# Patient Record
Sex: Female | Born: 1960 | Race: White | Hispanic: No | Marital: Married | Smoking: Never smoker
Health system: Southern US, Community
[De-identification: ages and names within clinical notes are randomized; demographics above are authoritative.]

## PROBLEM LIST (undated history)

## (undated) DIAGNOSIS — G43711 Chronic migraine without aura, intractable, with status migrainosus: Secondary | ICD-10-CM

## (undated) DIAGNOSIS — F32A Depression, unspecified: Secondary | ICD-10-CM

## (undated) DIAGNOSIS — M797 Fibromyalgia: Secondary | ICD-10-CM

## (undated) DIAGNOSIS — I10 Essential (primary) hypertension: Secondary | ICD-10-CM

## (undated) DIAGNOSIS — K7460 Unspecified cirrhosis of liver: Secondary | ICD-10-CM

## (undated) DIAGNOSIS — Z8489 Family history of other specified conditions: Secondary | ICD-10-CM

## (undated) DIAGNOSIS — L0390 Cellulitis, unspecified: Secondary | ICD-10-CM

## (undated) DIAGNOSIS — R519 Headache, unspecified: Secondary | ICD-10-CM

## (undated) DIAGNOSIS — M1990 Unspecified osteoarthritis, unspecified site: Secondary | ICD-10-CM

## (undated) DIAGNOSIS — F329 Major depressive disorder, single episode, unspecified: Secondary | ICD-10-CM

## (undated) DIAGNOSIS — C801 Malignant (primary) neoplasm, unspecified: Secondary | ICD-10-CM

## (undated) DIAGNOSIS — R51 Headache: Secondary | ICD-10-CM

## (undated) HISTORY — PX: OTHER SURGICAL HISTORY: SHX169

## (undated) HISTORY — PX: APPENDECTOMY: SHX54

## (undated) HISTORY — DX: Unspecified cirrhosis of liver: K74.60

## (undated) HISTORY — DX: Chronic migraine without aura, intractable, with status migrainosus: G43.711

## (undated) HISTORY — PX: ABDOMINAL HYSTERECTOMY: SHX81

## (undated) HISTORY — PX: BREAST SURGERY: SHX581

## (undated) HISTORY — PX: BACK SURGERY: SHX140

## (undated) HISTORY — PX: DILATION AND CURETTAGE OF UTERUS: SHX78

## (undated) HISTORY — DX: Cellulitis, unspecified: L03.90

## (undated) HISTORY — PX: CERVICAL SPINE SURGERY: SHX589

---

## 2015-08-01 ENCOUNTER — Other Ambulatory Visit: Payer: Self-pay | Admitting: Gastroenterology

## 2015-08-01 DIAGNOSIS — R634 Abnormal weight loss: Secondary | ICD-10-CM

## 2015-08-01 DIAGNOSIS — R1032 Left lower quadrant pain: Secondary | ICD-10-CM

## 2015-08-08 ENCOUNTER — Ambulatory Visit
Admission: RE | Admit: 2015-08-08 | Discharge: 2015-08-08 | Disposition: A | Discharge status: Home / Self Care | Payer: BLUE CROSS/BLUE SHIELD | Source: Ambulatory Visit | Attending: Gastroenterology | Admitting: Gastroenterology

## 2015-08-08 ENCOUNTER — Encounter (INDEPENDENT_AMBULATORY_CARE_PROVIDER_SITE_OTHER): Payer: Self-pay

## 2015-08-08 DIAGNOSIS — R1032 Left lower quadrant pain: Secondary | ICD-10-CM

## 2015-08-08 DIAGNOSIS — R634 Abnormal weight loss: Secondary | ICD-10-CM

## 2015-08-08 MED ORDER — IOPAMIDOL (ISOVUE-300) INJECTION 61%
125.0000 mL | Freq: Once | INTRAVENOUS | Status: AC | PRN
  Administered 2015-08-08: 125 mL via INTRAVENOUS

## 2016-01-20 ENCOUNTER — Other Ambulatory Visit: Payer: Self-pay | Admitting: Gastroenterology

## 2016-01-20 DIAGNOSIS — K7460 Unspecified cirrhosis of liver: Secondary | ICD-10-CM

## 2016-01-20 DIAGNOSIS — C220 Liver cell carcinoma: Secondary | ICD-10-CM

## 2016-01-31 ENCOUNTER — Other Ambulatory Visit: Payer: BLUE CROSS/BLUE SHIELD

## 2016-02-08 ENCOUNTER — Other Ambulatory Visit: Payer: BLUE CROSS/BLUE SHIELD

## 2016-03-23 ENCOUNTER — Encounter (HOSPITAL_COMMUNITY): Payer: Self-pay | Admitting: *Deleted

## 2016-03-23 ENCOUNTER — Other Ambulatory Visit: Payer: Self-pay | Admitting: Gastroenterology

## 2016-03-28 ENCOUNTER — Ambulatory Visit (HOSPITAL_COMMUNITY): Payer: BLUE CROSS/BLUE SHIELD | Admitting: Anesthesiology

## 2016-03-28 ENCOUNTER — Encounter (HOSPITAL_COMMUNITY): Payer: Self-pay

## 2016-03-28 ENCOUNTER — Encounter (HOSPITAL_COMMUNITY)
Admission: RE | Disposition: A | Discharge status: Home / Self Care | Payer: Self-pay | Source: Ambulatory Visit | Attending: Gastroenterology

## 2016-03-28 ENCOUNTER — Ambulatory Visit (HOSPITAL_COMMUNITY)
Admission: RE | Admit: 2016-03-28 | Discharge: 2016-03-28 | Disposition: A | Discharge status: Home / Self Care | Payer: BLUE CROSS/BLUE SHIELD | Source: Ambulatory Visit | Attending: Gastroenterology | Admitting: Gastroenterology

## 2016-03-28 DIAGNOSIS — Z6841 Body Mass Index (BMI) 40.0 and over, adult: Secondary | ICD-10-CM | POA: Diagnosis not present

## 2016-03-28 DIAGNOSIS — I10 Essential (primary) hypertension: Secondary | ICD-10-CM | POA: Diagnosis not present

## 2016-03-28 DIAGNOSIS — Z79899 Other long term (current) drug therapy: Secondary | ICD-10-CM | POA: Diagnosis not present

## 2016-03-28 DIAGNOSIS — K449 Diaphragmatic hernia without obstruction or gangrene: Secondary | ICD-10-CM | POA: Diagnosis not present

## 2016-03-28 DIAGNOSIS — K7460 Unspecified cirrhosis of liver: Secondary | ICD-10-CM | POA: Insufficient documentation

## 2016-03-28 HISTORY — PX: ESOPHAGOGASTRODUODENOSCOPY (EGD) WITH PROPOFOL: SHX5813

## 2016-03-28 HISTORY — DX: Headache: R51

## 2016-03-28 HISTORY — DX: Family history of other specified conditions: Z84.89

## 2016-03-28 HISTORY — DX: Fibromyalgia: M79.7

## 2016-03-28 HISTORY — DX: Essential (primary) hypertension: I10

## 2016-03-28 HISTORY — DX: Depression, unspecified: F32.A

## 2016-03-28 HISTORY — DX: Unspecified osteoarthritis, unspecified site: M19.90

## 2016-03-28 HISTORY — DX: Malignant (primary) neoplasm, unspecified: C80.1

## 2016-03-28 HISTORY — DX: Major depressive disorder, single episode, unspecified: F32.9

## 2016-03-28 HISTORY — DX: Headache, unspecified: R51.9

## 2016-03-28 SURGERY — ESOPHAGOGASTRODUODENOSCOPY (EGD) WITH PROPOFOL
Anesthesia: Monitor Anesthesia Care

## 2016-03-28 MED ORDER — LIDOCAINE 2% (20 MG/ML) 5 ML SYRINGE
INTRAMUSCULAR | Status: DC | PRN
  Administered 2016-03-28: 50 mg via INTRAVENOUS

## 2016-03-28 MED ORDER — SODIUM CHLORIDE 0.9 % IV SOLN: INTRAVENOUS | Status: DC

## 2016-03-28 MED ORDER — PROPOFOL 10 MG/ML IV BOLUS
INTRAVENOUS | Status: AC
  Filled 2016-03-28: qty 20

## 2016-03-28 MED ORDER — LACTATED RINGERS IV SOLN
INTRAVENOUS | Status: DC
  Administered 2016-03-28: 1000 mL via INTRAVENOUS

## 2016-03-28 MED ORDER — PROPOFOL 10 MG/ML IV BOLUS
INTRAVENOUS | Status: DC | PRN
  Administered 2016-03-28 (×2): 50 mg via INTRAVENOUS

## 2016-03-28 SURGICAL SUPPLY — 14 items

## 2016-03-28 NOTE — H&P (Signed)
Patient interval history reviewed.  Patient examined again.  There has been no change from documented H/P dated 03/23/16 (scanned into chart from our office) except as documented above.  Assessment:   1.  Cirrhosis.  Plan:  1.  Endoscopy for variceal screening. 2.  Risks (bleeding, infection, bowel perforation that could require surgery, sedation-related changes in cardiopulmonary systems), benefits (identification and possible treatment of source of symptoms, exclusion of certain causes of symptoms), and alternatives (watchful waiting, radiographic imaging studies, empiric medical treatment) of upper endoscopy (EGD) were explained to patient/family in detail and patient wishes to proceed.

## 2016-03-28 NOTE — Op Note (Signed)
[facility] Patient Name: Rhonda Osborne Procedure Date: 03/28/2016 MRN: [id number] Attending MD: Arta Silence , MD Date of Birth: Mar 29, 1961 CSN: [id number] Age: 55 Admit Type: Outpatient Procedure:                Upper GI endoscopy Indications:              Screening procedure, cirrhosis Providers:                Arta Silence, MD, Carolynn Comment, RN, Corliss Parish, Technician Referring MD:             Judge Stall, FNP; Alvino Chapel, MD Medicines:                Monitored Anesthesia Care Complications:            No immediate complications. Estimated Blood Loss:     Estimated blood loss: none. Procedure:                Pre-Anesthesia Assessment:                           - Prior to the procedure, a History and Physical                            was performed, and patient medications and                            allergies were reviewed. The patient's tolerance of                            previous anesthesia was also reviewed. The risks                            and benefits of the procedure and the sedation                            options and risks were discussed with the patient.                            All questions were answered, and informed consent                            was obtained. Prior Anticoagulants: The patient has                            taken no previous anticoagulant or antiplatelet                            agents. ASA Grade Assessment: III - A patient with                            severe systemic disease. After reviewing the risks  and benefits, the patient was deemed in                            satisfactory condition to undergo the procedure.                           After obtaining informed consent, the endoscope was                            passed under direct vision. Throughout the                            procedure, the patient's blood pressure,  pulse, and                            oxygen saturations were monitored continuously. The                            [id number] [id number]) scope was introduced through the                            mouth, and advanced to the second part of duodenum.                            The upper GI endoscopy was accomplished without                            difficulty. The patient tolerated the procedure                            well. Scope In: Scope Out: Findings:      The examined esophagus was normal. No esophageal varices.      A small hiatal hernia was present.      The exam of the stomach was otherwise normal. No gastric varices.      The duodenal bulb, first portion of the duodenum and second portion of       the duodenum were normal. Impression:               - Normal esophagus.                           - Small hiatal hernia.                           - Normal duodenal bulb, first portion of the                            duodenum and second portion of the duodenum. Moderate Sedation:      None Recommendation:           - Patient has a contact number available for                            emergencies. The signs and symptoms of potential  delayed complications were discussed with the                            patient. Return to normal activities tomorrow.                            Written discharge instructions were provided to the                            patient.                           - Discharge patient to home (ambulatory).                           - Resume previous diet today.                           - Continue present medications.                           - Return to GI clinic in 3 months.                           - Return to referring physician as previously                            scheduled. Procedure Code(s):        --- Professional ---                           [phone], Esophagogastroduodenoscopy, flexible,                             transoral; diagnostic, including collection of                            specimen(s) by brushing or washing, when performed                            (separate procedure) Diagnosis Code(s):        --- Professional ---                           K44.9, Diaphragmatic hernia without obstruction or                            gangrene                           Z13.810, Encounter for screening for upper                            gastrointestinal disorder CPT copyright 2016 [employer]. All rights reserved. The codes documented in this report are preliminary and upon coder review may  be revised to meet current compliance requirements. Arta Silence, MD 03/28/2016 11:17:43 AM This report has been signed  electronically. Number of Addenda: 0

## 2016-03-28 NOTE — Discharge Instructions (Signed)

## 2016-03-28 NOTE — Transfer of Care (Signed)
Immediate Anesthesia Transfer of Care Note  Patient: Rhonda Osborne  Procedure(s) Performed: Procedure(s): ESOPHAGOGASTRODUODENOSCOPY (EGD) WITH PROPOFOL (N/A)  Patient Location: PACU  Anesthesia Type:MAC  Level of Consciousness: awake, alert  and oriented  Airway & Oxygen Therapy: Patient Spontanous Breathing and Patient connected to nasal cannula oxygen  Post-op Assessment: Report given to RN and Post -op Vital signs reviewed and stable  Post vital signs: Reviewed and stable  Last Vitals:  Vitals:   03/28/16 0935  BP: (!) 142/81  Pulse: 75  Resp: 19  Temp: 37.2 C    Last Pain:  Vitals:   03/28/16 0935  TempSrc: Oral         Complications: No apparent anesthesia complications

## 2016-03-28 NOTE — Anesthesia Preprocedure Evaluation (Addendum)
Anesthesia Evaluation  Patient identified by MRN, date of birth, ID band Patient awake    Reviewed: Allergy & Precautions, NPO status , Patient's Chart, lab work & pertinent test results  Airway Mallampati: III  TM Distance: <3 FB Neck ROM: Full    Dental no notable dental hx.    Pulmonary neg pulmonary ROS,    Pulmonary exam normal breath sounds clear to auscultation       Cardiovascular hypertension, Pt. on medications and Pt. on home beta blockers Normal cardiovascular exam Rhythm:Regular Rate:Normal     Neuro/Psych negative neurological ROS  negative psych ROS   GI/Hepatic negative GI ROS, Neg liver ROS,   Endo/Other  Morbid obesity  Renal/GU negative Renal ROS  negative genitourinary   Musculoskeletal negative musculoskeletal ROS (+)   Abdominal   Peds negative pediatric ROS (+)  Hematology negative hematology ROS (+)   Anesthesia Other Findings   Reproductive/Obstetrics negative OB ROS                            Anesthesia Physical Anesthesia Plan  ASA: III  Anesthesia Plan: MAC   Post-op Pain Management:    Induction: Intravenous  Airway Management Planned: Nasal Cannula  Additional Equipment:   Intra-op Plan:   Post-operative Plan:   Informed Consent: I have reviewed the patients History and Physical, chart, labs and discussed the procedure including the risks, benefits and alternatives for the proposed anesthesia with the patient or authorized representative who has indicated his/her understanding and acceptance.   Dental advisory given  Plan Discussed with: CRNA and Surgeon  Anesthesia Plan Comments:        Anesthesia Quick Evaluation

## 2016-03-29 ENCOUNTER — Encounter (HOSPITAL_COMMUNITY): Payer: Self-pay | Admitting: Gastroenterology

## 2016-03-29 NOTE — Anesthesia Postprocedure Evaluation (Signed)
Anesthesia Post Note  Patient: Rhonda Osborne  Procedure(s) Performed: Procedure(s) (LRB): ESOPHAGOGASTRODUODENOSCOPY (EGD) WITH PROPOFOL (N/A)  Patient location during evaluation: PACU Anesthesia Type: MAC Level of consciousness: awake and alert Pain management: pain level controlled Vital Signs Assessment: post-procedure vital signs reviewed and stable Respiratory status: spontaneous breathing, nonlabored ventilation, respiratory function stable and patient connected to nasal cannula oxygen Cardiovascular status: stable and blood pressure returned to baseline Anesthetic complications: no    Last Vitals:  Vitals:   03/28/16 1110 03/28/16 1120  BP: (!) 126/45 (!) 138/47  Pulse: 76 65  Resp: 16 15  Temp:      Last Pain:  Vitals:   03/28/16 1105  TempSrc: Oral                 Braxtyn Dorff S

## 2016-07-24 ENCOUNTER — Ambulatory Visit (INDEPENDENT_AMBULATORY_CARE_PROVIDER_SITE_OTHER): Payer: BLUE CROSS/BLUE SHIELD | Admitting: Neurology

## 2016-07-24 ENCOUNTER — Encounter: Payer: Self-pay | Admitting: Neurology

## 2016-07-24 VITALS — BP 138/82 | HR 88 | Ht 64.0 in | Wt 239.0 lb

## 2016-07-24 DIAGNOSIS — G43711 Chronic migraine without aura, intractable, with status migrainosus: Secondary | ICD-10-CM | POA: Diagnosis not present

## 2016-07-24 DIAGNOSIS — R9089 Other abnormal findings on diagnostic imaging of central nervous system: Secondary | ICD-10-CM | POA: Diagnosis not present

## 2016-07-24 HISTORY — DX: Chronic migraine without aura, intractable, with status migrainosus: G43.711

## 2016-07-24 MED ORDER — ZONISAMIDE 100 MG PO CAPS: 100.0000 mg | ORAL_CAPSULE | Freq: Two times a day (BID) | ORAL | 3 refills | Status: DC

## 2016-07-24 MED ORDER — ZONISAMIDE 50 MG PO CAPS: 50.0000 mg | ORAL_CAPSULE | Freq: Every day | ORAL | 0 refills | Status: DC

## 2016-07-24 NOTE — Progress Notes (Signed)
Reason for visit: Possible multiple sclerosis  Referring physician: Dr. Trilby Drummer Rhonda Osborne is a 55 y.o. female  History of present illness:  Rhonda Osborne is a 56 year old right-handed white female with a diagnosis of multiple sclerosis that occurred in April 2016. The patient had been followed by Dr. Starleen Blue with severe neck pain. The patient was referred for surgery which eventually occurred. The patient had noted some gait instability, she has been using a cane for ambulation. The patient was sent for further evaluation and was noted to have a white matter lesion in the right parietal area, MRI brain report that I found from November 2016 indicates a single lesion in the right parietal white matter. The patient apparently underwent a visual evoked response test that was normal, and brainstem auditory evoked response test was normal. The patient never had a lumbar puncture. The patient was diagnosed with multiple sclerosis and placed on Tysabri. The patient continued to Tysabri until September 2017. The patient was seen by another neurologist for evaluation and the diagnosis of multiple sclerosis was questioned. The patient also has a history of chronic migraine headaches. The patient indicates that she has about 15-20 headache days a month, and many of her headaches may last 3 or 4 days. She has been on multiple medications for her headache including Imitrex, Maxalt, Topamax, atenolol, propranolol, metoprolol, amitriptyline, Lyrica, Cymbalta, Norvasc, and currently she is on Zonegran. The patient is gaining no benefit with her headaches. She has photophobia and phonophobia, and some nausea. The patient may have some blurring of vision. She has a very prominent family history of migraine that includes her father, 2 of her children, and 3 sisters. The patient has the diagnosis of fibromyalgia, she has pain in the neck, shoulders, back, thighs, and legs. She has interstitial cystitis. She walks with a  cane. She notes some numbness in the feet. She comes to this office for further management.  Past Medical History:  Diagnosis Date  . Arthritis   . Cancer ([place])    breast cancer-right  . Depression   . Family history of adverse reaction to anesthesia    mother age 72, in 33 , was to have exploratory surgery and was given a sedative before starting surgery and found to have unknown heart problem and died from medication she was given before the surgery  . Fibromyalgia   . Headache    migraines  . Hypertension   . Liver cirrhosis [facility])     Past Surgical History:  Procedure Laterality Date  . ABDOMINAL HYSTERECTOMY     left ovaries  . APPENDECTOMY    . BACK SURGERY    . BREAST SURGERY     right  . CERVICAL SPINE SURGERY     between C5-C6 and C6-C7  . CESAREAN SECTION  1991,1994   x 2  . DILATION AND CURETTAGE OF UTERUS    . ESOPHAGOGASTRODUODENOSCOPY (EGD) WITH PROPOFOL N/A 03/28/2016   Procedure: ESOPHAGOGASTRODUODENOSCOPY (EGD) WITH PROPOFOL;  Surgeon: Arta Silence, MD;  Location: [facility];  Service: Endoscopy;  Laterality: N/A;    Family History  Problem Relation Age of Onset  . Heart attack Mother   . Lung cancer Mother   . Heart attack Father     Social history:  reports that she has never smoked. She has never used smokeless tobacco. She reports that she does not drink alcohol or use drugs.  Medications:  Prior to Admission medications   Medication Sig Start Date End Date  Taking? Authorizing Provider  amLODipine (NORVASC) 2.5 MG tablet Take 2.5 mg by mouth at bedtime.   Yes Historical Provider, MD  Cyanocobalamin (VITAMIN B 12 PO) Take 2,500 mg by mouth at bedtime.   Yes Historical Provider, MD  diazepam (VALIUM) 5 MG tablet Take 5 mg by mouth at bedtime.   Yes Historical Provider, MD  DULoxetine (CYMBALTA) 60 MG capsule Take 60 mg by mouth at bedtime.   Yes Historical Provider, MD  HYDROcodone-acetaminophen (NORCO/VICODIN) 5-325 MG tablet Take 1 tablet by  mouth every 6 (six) hours as needed for moderate pain. Takes 1/2 tablet in morning and 1/2 tablet at bedtime   Yes Historical Provider, MD  letrozole (FEMARA) 2.5 MG tablet Take 2.5 mg by mouth daily. 04/18/16  Yes Historical Provider, MD  levofloxacin (LEVAQUIN) 500 MG tablet Take 500 mg by mouth daily. 07/20/16  Yes Historical Provider, MD  LYRICA 100 MG capsule Take 100 mg by mouth 3 (three) times daily. 06/29/16  Yes Historical Provider, MD  methocarbamol (ROBAXIN) 500 MG tablet Take 500 mg by mouth 4 (four) times daily. Takes 1/2 tablet in morning and 1/2 tablet at bedtime   Yes Historical Provider, MD  metoprolol succinate (TOPROL-XL) 100 MG 24 hr tablet Take 100 mg by mouth at bedtime. Take with or immediately following a meal.   Yes Historical Provider, MD  pentosan polysulfate (ELMIRON) 100 MG capsule Take 100 mg by mouth 2 (two) times daily. Takes 100 mg in morning and 200 mg at bedtime   Yes Historical Provider, MD  Vitamin D, Ergocalciferol, (DRISDOL) 50000 units CAPS capsule Take 50,000 Units by mouth every 7 (seven) days. Takes it on Tuesday every week   Yes Historical Provider, MD  zonisamide (ZONEGRAN) 100 MG capsule Take 1 capsule (100 mg total) by mouth 2 (two) times daily. 07/24/16   Kathrynn Ducking, MD  zonisamide (ZONEGRAN) 50 MG capsule Take 1 capsule (50 mg total) by mouth daily. 07/24/16   Kathrynn Ducking, MD      Allergies  Allergen Reactions  . Augmentin [Amoxicillin-Pot Clavulanate] Diarrhea  . Flagyl [Metronidazole]     Feeling of throat swelling  . Penicillins Diarrhea  . Tape     Red, itchy,puffy, and redness spread out from tape area    ROS:  Out of a complete 14 system review of symptoms, the patient complains only of the following symptoms, and all other reviewed systems are negative.  Fatigue Blurred vision Itching Incontinence of the bladder Increased thirst Joint pain, joint swelling, aching muscles Frequent infections Memory loss, confusion,  headache, numbness, weakness, slurred speech, tremor Depression, anxiety, too much sleep, decreased energy, change in appetite Insomnia  Blood pressure 138/82, pulse 88, height 5\' 4"  (1.626 m), weight 239 lb (108.4 kg).  Physical Exam  General: The patient is alert and cooperative at the time of the examination. The patient is markedly obese.  Eyes: Pupils are equal, round, and reactive to light. Discs are flat bilaterally.  Neck: The neck is supple, no carotid bruits are noted.  Respiratory: The respiratory examination is clear.  Cardiovascular: The cardiovascular examination reveals a regular rate and rhythm, no obvious murmurs or rubs are noted.  Skin: Extremities are without significant edema.  Neurologic Exam  Mental status: The patient is alert and oriented x 3 at the time of the examination. The patient has apparent normal recent and remote memory, with an apparently normal attention span and concentration ability. Mini-Mental status done today shows a total score of 30/30.  Cranial nerves: Facial symmetry is present. There is good sensation of the face to pinprick and soft touch bilaterally. The strength of the facial muscles and the muscles to head turning and shoulder shrug are normal bilaterally. Speech is well enunciated, no aphasia or dysarthria is noted. Extraocular movements are full. Visual fields are full. The tongue is midline, and the patient has symmetric elevation of the soft palate. No obvious hearing deficits are noted.  Motor: The motor testing reveals 5 over 5 strength of all 4 extremities. Good symmetric motor tone is noted throughout.  Sensory: Sensory testing is intact to pinprick, soft touch, vibration sensation, and position sense on all 4 extremities, with exception of some decreased pinprick sensation below the knees bilaterally. No evidence of extinction is noted.  Coordination: Cerebellar testing reveals good finger-nose-finger and heel-to-shin  bilaterally.  Gait and station: Gait is normal. The patient normally walks with a cane. Tandem gait is normal. Romberg is negative. No drift is seen.  Reflexes: Deep tendon reflexes are symmetric and normal bilaterally. Toes are downgoing bilaterally.   Assessment/Plan:  1. Diagnosis of multiple sclerosis  2. Fibromyalgia  3. Intractable migraine headache  4. Interstitial cystitis  5. Obesity  The diagnosis of multiple sclerosis in my mind is in question. The MRI report done on 04/03/2015 shows a single white matter lesion in the left parietal area. The patient has a history of chronic migraine, which may result in white matter lesions. MRI of the cervical spine does not show any abnormalities within the spinal cord. The patient has had a normal visual evoked response test, and a normal brainstem auditory evoked response test. Lumbar puncture was never done. The patient will be treated for her migraine, she will be increased on the dose of Zonegran eventually to 100 mg twice daily. I will try to get her set up for Botox injections. I will set up a lumbar puncture to look for oligoclonal banding to confirm or exclude the diagnosis of multiple sclerosis. She will follow-up in 4 months.  Rhonda Alexanders MD 07/24/2016 3:51 PM  [facility] [address] [place], [place] [address]  Phone [phone] Fax [phone]

## 2016-07-26 ENCOUNTER — Telehealth: Payer: Self-pay | Admitting: Neurology

## 2016-07-26 NOTE — Telephone Encounter (Signed)
I called patient to schedule botox injections. She did not answer so I left a VM asking her to call me back.

## 2016-07-31 ENCOUNTER — Telehealth: Payer: Self-pay | Admitting: Neurology

## 2016-07-31 NOTE — Telephone Encounter (Signed)
I reviewed the MRI the brain that was done on 01/28/2015. The study to me looks essentially normal, I do not see any evidence of significant white matter lesions, no evidence of changes consistent with multiple sclerosis.  MRI of the cervical spine done on the same date did not show any myelopathic changes, minimal disc bulges were noted at the C4-5, C 5-6, and C6-7 levels. The patient has had prior fusion at the C5 and C6 levels. MRI of the lumbar spine was done on 01/05/2016. Some scoliosis was noted. The study was otherwise unremarkable, minimal disc bulge at the L3-4 level.  I called the patient, talk with the husband, I do not see any evidence of multiple sclerosis on MRI the brain done in 2016. The patient will be set up for lumbar puncture to look for oligoclonal banding.

## 2016-08-08 ENCOUNTER — Ambulatory Visit
Admission: RE | Admit: 2016-08-08 | Discharge: 2016-08-08 | Disposition: A | Discharge status: Home / Self Care | Payer: BLUE CROSS/BLUE SHIELD | Source: Ambulatory Visit | Attending: Neurology | Admitting: Neurology

## 2016-08-08 DIAGNOSIS — G43711 Chronic migraine without aura, intractable, with status migrainosus: Secondary | ICD-10-CM

## 2016-08-08 DIAGNOSIS — R9089 Other abnormal findings on diagnostic imaging of central nervous system: Secondary | ICD-10-CM

## 2016-08-08 LAB — CSF CELL COUNT WITH DIFFERENTIAL
RBC Count, CSF: 3 cells/uL (ref 0–10)
WBC, CSF: 3 cells/uL (ref 0–5)

## 2016-08-08 LAB — GLUCOSE, CSF: Glucose, CSF: 72 mg/dL (ref 43–76)

## 2016-08-08 LAB — PROTEIN, CSF: Total Protein, CSF: 43 mg/dL (ref 15–45)

## 2016-08-08 NOTE — Discharge Instructions (Signed)

## 2016-08-08 NOTE — Progress Notes (Signed)
1 SST tube drawn from left AC. Site is unremarkable and pt tolerated procedure well.

## 2016-08-10 ENCOUNTER — Telehealth: Payer: Self-pay

## 2016-08-10 LAB — ANGIOTENSIN CONVERTING ENZYME, CSF: ACE, CSF: 10 U/L (ref <=15)

## 2016-08-10 LAB — VDRL, CSF: VDRL Quant, CSF: NONREACTIVE

## 2016-08-10 NOTE — Telephone Encounter (Signed)
Spoke with patient's husband after Rhonda Osborne had an LP here 08/08/16.  He states she is doing well, just a little sore in her back from the procedure.  He denies she developed a headache.  jkl

## 2016-08-13 LAB — B. BURGDORFI ANTIBODIES, CSF: LYME AB: NEGATIVE

## 2016-08-17 ENCOUNTER — Telehealth: Payer: Self-pay | Admitting: Neurology

## 2016-08-17 DIAGNOSIS — R9089 Other abnormal findings on diagnostic imaging of central nervous system: Secondary | ICD-10-CM

## 2016-08-17 DIAGNOSIS — R269 Unspecified abnormalities of gait and mobility: Secondary | ICD-10-CM

## 2016-08-17 LAB — OLIGOCLONAL BANDS, CSF + SERM

## 2016-08-17 NOTE — Telephone Encounter (Signed)
I called patient, talk with the husband. 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  I will set patient up for blood work to look for a connective tissue disease process or a chronic infection. The patient has been determined to have cirrhosis of the liver on a CT scan of the abdomen, but the husband indicates that she never really had any thorough investigation into why this had occurred.  The patient has had a hepatitis A and hepatitis B vaccination.  The spinal fluid analysis is not diagnostic for typical for multiple sclerosis. I do not believe that this patient has multiple sclerosis.  I will order blood work on this patient to look for other abnormalities that could produce the oligoclonal spikes in the peripheral blood and central nervous system.

## 2016-08-22 ENCOUNTER — Other Ambulatory Visit (INDEPENDENT_AMBULATORY_CARE_PROVIDER_SITE_OTHER): Payer: Self-pay

## 2016-08-22 DIAGNOSIS — Z0289 Encounter for other administrative examinations: Secondary | ICD-10-CM

## 2016-08-22 DIAGNOSIS — R9089 Other abnormal findings on diagnostic imaging of central nervous system: Secondary | ICD-10-CM

## 2016-08-22 DIAGNOSIS — R269 Unspecified abnormalities of gait and mobility: Secondary | ICD-10-CM

## 2016-08-24 ENCOUNTER — Telehealth: Payer: Self-pay | Admitting: *Deleted

## 2016-08-24 LAB — PAN-ANCA
ANCA Proteinase 3: <3.5 U/mL (ref 0.0–3.5)
Atypical pANCA: <1:20 {titer}
Myeloperoxidase Ab: <9 U/mL (ref 0.0–9.0)

## 2016-08-24 LAB — EBV, CHRONIC/ACTIVE INFECTION
EBV Early Antigen Ab, IgG: <9 U/mL (ref 0.0–8.9)
EBV NA IGG: 188 U/mL — AB (ref 0.0–17.9)
EBV VCA IGG: 244 U/mL — AB (ref 0.0–17.9)

## 2016-08-24 LAB — MULTIPLE MYELOMA PANEL, SERUM
ALBUMIN SERPL ELPH-MCNC: 4 g/dL (ref 2.9–4.4)
Albumin/Glob SerPl: 1.7 (ref 0.7–1.7)
Alpha 1: 0.2 g/dL (ref 0.0–0.4)
Alpha2 Glob SerPl Elph-Mcnc: 0.5 g/dL (ref 0.4–1.0)
B-Globulin SerPl Elph-Mcnc: 1 g/dL (ref 0.7–1.3)
Gamma Glob SerPl Elph-Mcnc: 0.8 g/dL (ref 0.4–1.8)
Globulin, Total: 2.4 g/dL (ref 2.2–3.9)
IGA/IMMUNOGLOBULIN A, SERUM: 172 mg/dL (ref 87–352)
IgG (Immunoglobin G), Serum: 657 mg/dL — ABNORMAL LOW (ref 700–1600)
IgM (Immunoglobulin M), Srm: 59 mg/dL (ref 26–217)
TOTAL PROTEIN: 6.4 g/dL (ref 6.0–8.5)

## 2016-08-24 LAB — RHEUMATOID FACTOR: Rhuematoid fact SerPl-aCnc: <10 IU/mL (ref 0.0–13.9)

## 2016-08-24 LAB — GLIADIN ANTIBODIES, SERUM
Antigliadin Abs, IgA: 4 units (ref 0–19)
Gliadin IgG: 2 units (ref 0–19)

## 2016-08-24 LAB — ANA W/REFLEX: ANA: NEGATIVE

## 2016-08-24 LAB — HEPATITIS B CORE ANTIBODY, TOTAL: Hep B Core Total Ab: NEGATIVE

## 2016-08-24 LAB — HIV ANTIBODY (ROUTINE TESTING W REFLEX): HIV SCREEN 4TH GENERATION: NONREACTIVE

## 2016-08-24 LAB — SEDIMENTATION RATE: Sed Rate: 5 mm/hr (ref 0–40)

## 2016-08-24 LAB — HEPATITIS C ANTIBODY

## 2016-08-24 NOTE — Telephone Encounter (Signed)
Called and left detailed message about lab results per CW,MD note. Gave [facility] phone number if she has further questions or concerns.

## 2016-08-24 NOTE — Telephone Encounter (Signed)
-----   Message from Kathrynn Ducking, MD sent at 08/24/2016  7:35 AM EDT ----- Blood work is unremarkable, EBV viral panel indicates prior exposure, this pattern is seen in the majority of population. Please call the patient. ----- Message ----- From: Lavone Neri Lab Results In Sent: 08/23/2016   7:42 AM To: Kathrynn Ducking, MD

## 2016-08-31 ENCOUNTER — Other Ambulatory Visit: Payer: Self-pay | Admitting: Gastroenterology

## 2016-08-31 DIAGNOSIS — K7469 Other cirrhosis of liver: Secondary | ICD-10-CM

## 2016-09-07 ENCOUNTER — Ambulatory Visit
Admission: RE | Admit: 2016-09-07 | Discharge: 2016-09-07 | Disposition: A | Discharge status: Home / Self Care | Payer: BLUE CROSS/BLUE SHIELD | Source: Ambulatory Visit | Attending: Gastroenterology | Admitting: Gastroenterology

## 2016-09-07 DIAGNOSIS — K7469 Other cirrhosis of liver: Secondary | ICD-10-CM

## 2016-11-22 ENCOUNTER — Ambulatory Visit: Payer: BLUE CROSS/BLUE SHIELD | Admitting: Neurology

## 2016-11-25 ENCOUNTER — Other Ambulatory Visit: Payer: Self-pay | Admitting: Neurology

## 2016-11-26 ENCOUNTER — Ambulatory Visit (INDEPENDENT_AMBULATORY_CARE_PROVIDER_SITE_OTHER): Payer: BLUE CROSS/BLUE SHIELD | Admitting: Neurology

## 2016-11-26 ENCOUNTER — Encounter: Payer: Self-pay | Admitting: Neurology

## 2016-11-26 ENCOUNTER — Other Ambulatory Visit: Payer: Self-pay

## 2016-11-26 VITALS — BP 154/83 | HR 94 | Ht 64.0 in | Wt 226.0 lb

## 2016-11-26 DIAGNOSIS — G43711 Chronic migraine without aura, intractable, with status migrainosus: Secondary | ICD-10-CM

## 2016-11-26 DIAGNOSIS — R9089 Other abnormal findings on diagnostic imaging of central nervous system: Secondary | ICD-10-CM

## 2016-11-26 MED ORDER — ZONISAMIDE 100 MG PO CAPS: 100.0000 mg | ORAL_CAPSULE | Freq: Two times a day (BID) | ORAL | 3 refills | Status: DC

## 2016-11-26 MED ORDER — TIZANIDINE HCL 2 MG PO TABS: 2.0000 mg | ORAL_TABLET | Freq: Three times a day (TID) | ORAL | 2 refills | Status: DC

## 2016-11-26 NOTE — Patient Instructions (Signed)
   We will go on Tizanidine 2 mg three times a day.

## 2016-11-26 NOTE — [place] Notes (Signed)
Reason for visit: Headache  Rhonda Osborne is an 56 y.o. female  History of present illness:  Rhonda Osborne is a 56 year old right handed white female with a history of headache and neuromuscular pain of the neck, low back, and legs. The patient has been placed on Zonegran which seems to help her headaches, she will have a severe headache only once every 3 weeks or so. The patient does have neck pain and shoulder discomfort, she is bothered by pain in the mid back, and discomfort down both legs. The patient has interstitial cystitis as well. She has been on Cymbalta and Lyrica without complete improvement in her pain. The patient uses a cane for ambulation. She feels better when she is lying down, worse when she is sitting in a car seat or if she is up on her feet. The patient does have some chronic issues with balance that may have worsened some on the Lyrica. She takes hydrocodone twice a day. The patient is having tremors involving the upper extremities, she has some slight memory issues and she has floaters in the eyes. She has a lot of allergy problems. She comes to this office for an evaluation. She has had an evaluation for multiple sclerosis, lumbar puncture did not show evidence of oligoclonal banding within the central nervous system. Blood work studies were unremarkable. The patient is followed as well for fatty liver with cirrhosis of the liver.  Past Medical History:  Diagnosis Date  . Arthritis   . Cancer ([place])    breast cancer-right  . Chronic migraine without aura, with intractable migraine, so stated, with status migrainosus 07/24/2016  . Depression   . Family history of adverse reaction to anesthesia    mother age 68, in 28 , was to have exploratory surgery and was given a sedative before starting surgery and found to have unknown heart problem and died from medication she was given before the surgery  . Fibromyalgia   . Headache    migraines  . Hypertension   . Liver  cirrhosis [facility])     Past Surgical History:  Procedure Laterality Date  . ABDOMINAL HYSTERECTOMY     left ovaries  . APPENDECTOMY    . BACK SURGERY    . BREAST SURGERY     right  . CERVICAL SPINE SURGERY     between C5-C6 and C6-C7  . CESAREAN SECTION  1991,1994   x 2  . DILATION AND CURETTAGE OF UTERUS    . ESOPHAGOGASTRODUODENOSCOPY (EGD) WITH PROPOFOL N/A 03/28/2016   Procedure: ESOPHAGOGASTRODUODENOSCOPY (EGD) WITH PROPOFOL;  Surgeon: Arta Silence, MD;  Location: [facility];  Service: Endoscopy;  Laterality: N/A;    Family History  Problem Relation Age of Onset  . Heart attack Mother   . Lung cancer Mother   . Heart attack Father     Social history:  reports that she has never smoked. She has never used smokeless tobacco. She reports that she does not drink alcohol or use drugs.    Allergies  Allergen Reactions  . Augmentin [Amoxicillin-Pot Clavulanate] Diarrhea  . Flagyl [Metronidazole]     Feeling of throat swelling  . Penicillins Diarrhea  . Tape     Red, itchy,puffy, and redness spread out from tape area    Medications:  Prior to Admission medications   Medication Sig Start Date End Date Taking? Authorizing Provider  amLODipine (NORVASC) 2.5 MG tablet Take 2.5 mg by mouth at bedtime.   Yes [provider]  Cyanocobalamin (VITAMIN B 12 PO) Take 2,500 mg by mouth at bedtime.   Yes [provider]  diazepam (VALIUM) 5 MG tablet Take 5 mg by mouth at bedtime.   Yes [provider]  DULoxetine (CYMBALTA) 60 MG capsule Take 60 mg by mouth at bedtime.   Yes [provider]  HYDROcodone-acetaminophen (NORCO) 10-325 MG tablet TAKE 1 TABLET BY MOUTH EVERY 6 HOURS AS NEEDED FOR PAIN FOR 30 DAYS 10/10/16  Yes [provider]  letrozole (FEMARA) 2.5 MG tablet Take 2.5 mg by mouth daily. 04/18/16  Yes [provider]  levofloxacin (LEVAQUIN) 500 MG tablet Take 500 mg by mouth daily. 07/20/16  Yes [provider]   LYRICA 100 MG capsule Take 100 mg by mouth 3 (three) times daily. 06/29/16  Yes [provider]  methocarbamol (ROBAXIN) 500 MG tablet Taking 0.25 tablet once daily.   Yes [provider]  pentosan polysulfate (ELMIRON) 100 MG capsule Take 100 mg by mouth 2 (two) times daily. Takes 100 mg in morning and 200 mg at bedtime   Yes [provider]  Vitamin D, Ergocalciferol, (DRISDOL) 50000 units CAPS capsule Take 50,000 Units by mouth every 7 (seven) days. Takes it on Tuesday every week   Yes [provider]  zonisamide (ZONEGRAN) 100 MG capsule Take 1 capsule (100 mg total) by mouth 2 (two) times daily. 07/24/16  Yes Kathrynn Ducking, MD    ROS:  Out of a complete 14 system review of symptoms, the patient complains only of the following symptoms, and all other reviewed systems are negative.  Decreased appetite, weight gain Ear discharge, hearing loss, ear pain, difficulty swallowing, drooling Eye discharge, light sensitivity, eye pain, blurred vision Wheezing, shortness of breath Cold intolerance, heat intolerance, excessive thirst Swollen abdomen, abdominal pain, rectal bleeding, constipation, diarrhea, nausea Insomnia, frequent waking, daytime sleepiness, snoring, sleep talking, acting out dreams Incontinence of the bladder Joint pain, joint swelling, back pain, achy muscles, muscle cramps, walking difficulty, neck pain, neck stiffness Skin rash, itching Memory loss, dizziness, headache, numbness, speech difficulty, weakness, tremors Depression, anxiety, hallucinations, suicidal thoughts, self injury  Blood pressure (!) 154/83, pulse 94, height 5\' 4"  (1.626 m), weight 226 lb (102.5 kg).  Physical Exam  General: The patient is alert and cooperative at the time of the examination. The patient is markedly obese.  Neuromuscular: Range of movement of the cervical spine is relatively full.  Skin: No significant peripheral edema is noted.   Neurologic  Exam  Mental status: The patient is alert and oriented x 3 at the time of the examination. The patient has apparent normal recent and remote memory, with an apparently normal attention span and concentration ability.   Cranial nerves: Facial symmetry is present. Speech is normal, no aphasia or dysarthria is noted. Extraocular movements are full. Visual fields are full.  Motor: The patient has good strength in all 4 extremities.  Sensory examination: Soft touch sensation is symmetric on the face, arms, and legs.  Coordination: The patient has good finger-nose-finger and heel-to-shin bilaterally.  Gait and station: The patient has a normal gait. The patient uses a cane for ambulation. Tandem gait is slightly unsteady. Romberg is negative. No drift is seen.  Reflexes: Deep tendon reflexes are symmetric.   Assessment/Plan:  1. History of headaches  2. Probable fibromyalgia  3. Interstitial cystitis  The patient has ongoing total-body neuromuscular discomfort. MRI of the low back showed only a disc bulge at the L3-4 level. The  patient is on Cymbalta and Lyrica. Tizanidine will be added taking 2 mg 3 times daily. The patient will call in for any dose adjustments. I have recommended that she get into a low grade exercise program, and get into a diet such as weight watcher's diet for weight loss. The patient will follow-up in 4 months.   Jill Alexanders MD 11/26/2016 1:07 PM  [facility] [address] [place], [place] [address]  Phone [phone] Fax [phone]

## 2017-02-24 ENCOUNTER — Other Ambulatory Visit: Payer: Self-pay | Admitting: Neurology

## 2017-03-18 ENCOUNTER — Other Ambulatory Visit: Payer: Self-pay | Admitting: Gastroenterology

## 2017-03-18 DIAGNOSIS — K7460 Unspecified cirrhosis of liver: Secondary | ICD-10-CM

## 2017-03-23 ENCOUNTER — Other Ambulatory Visit: Payer: Self-pay | Admitting: Neurology

## 2017-03-25 ENCOUNTER — Ambulatory Visit
Admission: RE | Admit: 2017-03-25 | Discharge: 2017-03-25 | Disposition: A | Discharge status: Home / Self Care | Payer: BLUE CROSS/BLUE SHIELD | Source: Ambulatory Visit | Attending: Gastroenterology | Admitting: Gastroenterology

## 2017-03-25 DIAGNOSIS — K7460 Unspecified cirrhosis of liver: Secondary | ICD-10-CM

## 2017-03-26 ENCOUNTER — Other Ambulatory Visit: Payer: Self-pay | Admitting: Gastroenterology

## 2017-03-26 DIAGNOSIS — K7460 Unspecified cirrhosis of liver: Secondary | ICD-10-CM

## 2017-03-26 DIAGNOSIS — K769 Liver disease, unspecified: Secondary | ICD-10-CM

## 2017-04-09 ENCOUNTER — Ambulatory Visit: Payer: BLUE CROSS/BLUE SHIELD | Admitting: Adult Health

## 2017-04-09 ENCOUNTER — Telehealth: Payer: Self-pay | Admitting: *Deleted

## 2017-04-09 NOTE — Telephone Encounter (Signed)
Patient was no show for follow up today with NP. 

## 2017-04-10 ENCOUNTER — Ambulatory Visit
Admission: RE | Admit: 2017-04-10 | Discharge: 2017-04-10 | Disposition: A | Discharge status: Home / Self Care | Payer: BLUE CROSS/BLUE SHIELD | Source: Ambulatory Visit | Attending: Gastroenterology | Admitting: Gastroenterology

## 2017-04-10 ENCOUNTER — Encounter: Payer: Self-pay | Admitting: Adult Health

## 2017-04-10 DIAGNOSIS — K769 Liver disease, unspecified: Secondary | ICD-10-CM

## 2017-04-10 DIAGNOSIS — K7460 Unspecified cirrhosis of liver: Secondary | ICD-10-CM

## 2017-04-10 MED ORDER — GADOBENATE DIMEGLUMINE 529 MG/ML IV SOLN
20.0000 mL | Freq: Once | INTRAVENOUS | Status: AC | PRN
  Administered 2017-04-10: 20 mL via INTRAVENOUS

## 2017-04-17 ENCOUNTER — Encounter: Payer: Self-pay | Admitting: Adult Health

## 2017-04-17 ENCOUNTER — Ambulatory Visit (INDEPENDENT_AMBULATORY_CARE_PROVIDER_SITE_OTHER): Payer: BLUE CROSS/BLUE SHIELD | Admitting: Adult Health

## 2017-04-17 VITALS — BP 144/75 | HR 76 | Ht 64.0 in | Wt 224.5 lb

## 2017-04-17 DIAGNOSIS — E669 Obesity, unspecified: Secondary | ICD-10-CM

## 2017-04-17 DIAGNOSIS — Z6838 Body mass index (BMI) 38.0-38.9, adult: Secondary | ICD-10-CM

## 2017-04-17 DIAGNOSIS — G43019 Migraine without aura, intractable, without status migrainosus: Secondary | ICD-10-CM

## 2017-04-17 NOTE — [place] Notes (Signed)
I have read the note, and I agree with the clinical assessment and plan.  Rhonda Osborne   

## 2017-04-17 NOTE — [place] Notes (Signed)
PATIENT: Rhonda Osborne DOB: 17-Feb-1961  REASON FOR VISIT: follow up- migraine and  HISTORY FROM: patient  HISTORY OF PRESENT ILLNESS: Today 04/17/17 Rhonda Osborne is a 56 year old female with a history of headache and neuromuscular pain.  She returns today for follow-up.  The patient states that in the last 4 weeks her migraines have worsened.  She states that she has 2-3 headaches a week but her headaches can last anywhere from 2-4 days.  She reports that she does have photophobia, phonophobia and recently has had nausea.  She states that typically she has to go to bed in order to get any relief.  She has been referred to Botox in the past but never had any follow-up.  She has tried multiple medications in the past including tizanidine, Imitrex, Maxalt, Topamax, atenolol, propranolol, metoprolol, amitriptyline, Lyrica, Cymbalta, Norvasc, and currently she is on Zonegran. she does have a rheumatologist that manages her fibromyalgia but she reports that she may be looking for another physician.  She continues to have pain in the low back.  She is currently on Lyrica and has tried other medications in the past without complete relief.  She returns today for follow-up.  HISTORY Rhonda Osborne is a 56 year old right handed white female with a history of headache and neuromuscular pain of the neck, low back, and legs. The patient has been placed on Zonegran which seems to help her headaches, she will have a severe headache only once every 3 weeks or so. The patient does have neck pain and shoulder discomfort, she is bothered by pain in the mid back, and discomfort down both legs. The patient has interstitial cystitis as well. She has been on Cymbalta and Lyrica without complete improvement in her pain. The patient uses a cane for ambulation. She feels better when she is lying down, worse when she is sitting in a car seat or if she is up on her feet. The patient does have some chronic issues with balance that  may have worsened some on the Lyrica. She takes hydrocodone twice a day. The patient is having tremors involving the upper extremities, she has some slight memory issues and she has floaters in the eyes. She has a lot of allergy problems. She comes to this office for an evaluation. She has had an evaluation for multiple sclerosis, lumbar puncture did not show evidence of oligoclonal banding within the central nervous system. Blood work studies were unremarkable. The patient is followed as well for fatty liver with cirrhosis of the liver.  REVIEW OF SYSTEMS: Out of a complete 14 system review of symptoms, the patient complains only of the following symptoms, and all other reviewed systems are negative.  Fatigue, fever, ear discharge, hearing loss, eye redness, swollen abdomen, abdominal pain, insomnia, frequent waking, sleep talking, acting out dreams, joint pain, back pain, aching muscles, muscle cramps, walking difficulty, neck pain, neck stiffness, wound, rash, itching, confusion, decreased concentration, depression, nervous/anxious, memory loss, dizziness, headache, numbness, speech difficulty, weakness, tremors, bruise/bleed easily  ALLERGIES: Allergies  Allergen Reactions  . Augmentin [Amoxicillin-Pot Clavulanate] Diarrhea  . Flagyl [Metronidazole]     Feeling of throat swelling  . Penicillins Diarrhea  . Tape     Red, itchy,puffy, and redness spread out from tape area    HOME MEDICATIONS: Outpatient Medications Prior to Visit  Medication Sig Dispense Refill  . amLODipine (NORVASC) 2.5 MG tablet Take 2.5 mg by mouth at bedtime.    . Cyanocobalamin (VITAMIN B 12 PO)  Take 2,500 mg by mouth at bedtime.    . diazepam (VALIUM) 5 MG tablet Take 5 mg by mouth at bedtime.    . DULoxetine (CYMBALTA) 60 MG capsule Take 60 mg by mouth at bedtime.    Marland Kitchen HYDROcodone-acetaminophen (NORCO) 10-325 MG tablet TAKE 1 TABLET BY MOUTH EVERY 6 HOURS AS NEEDED FOR PAIN FOR 30 DAYS  0  . letrozole (FEMARA) 2.5  MG tablet Take 2.5 mg by mouth daily.    Marland Kitchen levofloxacin (LEVAQUIN) 500 MG tablet Take 500 mg by mouth daily.    Marland Kitchen LYRICA 100 MG capsule Take 100 mg by mouth 3 (three) times daily.    . methocarbamol (ROBAXIN) 500 MG tablet Taking 0.25 tablet once daily.    . pentosan polysulfate (ELMIRON) 100 MG capsule Take 100 mg by mouth 2 (two) times daily. Takes 100 mg in morning and 200 mg at bedtime    . tiZANidine (ZANAFLEX) 2 MG tablet TAKE 1 TABLET (2 MG TOTAL) BY MOUTH 3 (THREE) TIMES DAILY. 90 tablet 2  . Vitamin D, Ergocalciferol, (DRISDOL) 50000 units CAPS capsule Take 50,000 Units by mouth every 7 (seven) days. Takes it on Tuesday every week    . zonisamide (ZONEGRAN) 100 MG capsule TAKE 1 CAPSULE BY MOUTH TWICE A DAY 60 capsule 3   No facility-administered medications prior to visit.     PAST MEDICAL HISTORY: Past Medical History:  Diagnosis Date  . Arthritis   . Cancer ([place])    breast cancer-right  . Chronic migraine without aura, with intractable migraine, so stated, with status migrainosus 07/24/2016  . Depression   . Family history of adverse reaction to anesthesia    mother age 24, in 39 , was to have exploratory surgery and was given a sedative before starting surgery and found to have unknown heart problem and died from medication she was given before the surgery  . Fibromyalgia   . Headache    migraines  . Hypertension   . Liver cirrhosis ([place])     PAST SURGICAL HISTORY: Past Surgical History:  Procedure Laterality Date  . ABDOMINAL HYSTERECTOMY     left ovaries  . APPENDECTOMY    . BACK SURGERY    . BREAST SURGERY     right  . CERVICAL SPINE SURGERY     between C5-C6 and C6-C7  . CESAREAN SECTION  1991,1994   x 2  . DILATION AND CURETTAGE OF UTERUS    . ESOPHAGOGASTRODUODENOSCOPY (EGD) WITH PROPOFOL N/A 03/28/2016   Procedure: ESOPHAGOGASTRODUODENOSCOPY (EGD) WITH PROPOFOL;  Surgeon: Arta Silence, MD;  Location: [facility];  Service: Endoscopy;  Laterality:  N/A;    FAMILY HISTORY: Family History  Problem Relation Age of Onset  . Heart attack Mother   . Lung cancer Mother   . Heart attack Father     SOCIAL HISTORY: Social History   Socioeconomic History  . Marital status: Married    Spouse name: Not on file  . Number of children: 2  . Years of education: 65  . Highest education level: Not on file  Social Needs  . Financial resource strain: Not on file  . Food insecurity - worry: Not on file  . Food insecurity - inability: Not on file  . Transportation needs - medical: Not on file  . Transportation needs - non-medical: Not on file  Occupational History  . Not on file  Tobacco Use  . Smoking status: Never Smoker  . Smokeless tobacco: Never Used  Substance and Sexual  Activity  . Alcohol use: No  . Drug use: No  . Sexual activity: Not on file  Other Topics Concern  . Not on file  Social History Narrative   Lives   Caffeine use:       PHYSICAL EXAM  Vitals:   04/17/17 1315  BP: (!) 144/75  Pulse: 76  Weight: 224 lb 8 oz (101.8 kg)  Height: 5\' 4"  (1.626 m)   Body mass index is 38.54 kg/m.  Generalized: Well developed, in no acute distress   Neurological examination  Mentation: Alert oriented to time, place, history taking. Follows all commands speech and language fluent Cranial nerve II-XII: Pupils were equal round reactive to light. Extraocular movements were full, visual field were full on confrontational test. Facial sensation and strength were normal. Uvula tongue midline. Head turning and shoulder shrug  were normal and symmetric. Motor: The motor testing reveals 5 over 5 strength of all 4 extremities. Good symmetric motor tone is noted throughout.  Sensory: Sensory testing is intact to soft touch on all 4 extremities. No evidence of extinction is noted.  Coordination: Cerebellar testing reveals good finger-nose-finger and heel-to-shin bilaterally.  Gait and station: Patient uses a cane when ambulating.   Tandem gait not attempted. Reflexes: Deep tendon reflexes are symmetric and normal bilaterally.   DIAGNOSTIC DATA (LABS, IMAGING, TESTING) - I reviewed patient records, labs, notes, testing and imaging myself where available.  No results found for: WBC, HGB, HCT, MCV, PLT    Component Value Date/Time   PROT 6.4 08/22/2016 1543     ASSESSMENT AND PLAN 56 y.o. year old female  has a past medical history of Arthritis, Cancer ([place]), Chronic migraine without aura, with intractable migraine, so stated, with status migrainosus (07/24/2016), Depression, Family history of adverse reaction to anesthesia, Fibromyalgia, Headache, Hypertension, and Liver cirrhosis ([place]). here with:  1.  Migraine headaches 2.  Neuromuscular discomfort 3. obese  The patient has tried multiple medications in the past including tizanidineImitrex, Maxalt, Topamax, atenolol, propranolol, metoprolol, amitriptyline, Lyrica, Cymbalta, Norvasc, and currently she is on Zonegran without good control of her migraines.  She will be referred for Botox injections.  I have reviewed the side effects of Botox with the patient.  She voices understanding.  For now she will continue on Zonegranfor her migraines and tizanidine for low back pain.  The patient is interested in weight loss.  I will send a referral over to Dr. Leafy Ro at [facility] health healthy weight and wellness.  She is advised that if her symptoms worsen or she develops new symptoms she should let us know.  She will follow-up in 6 months or sooner if needed.     Ward Givens, MSN, NP-C 04/17/2017, 11:37 AM [facility] [address], [place], [place] [address] [phone]

## 2017-04-17 NOTE — Patient Instructions (Addendum)
Your Plan:  Continue Zonegran and Tizanidine Referral for botox Referral for weight management If your symptoms worsen or you develop new symptoms please let us know.    Thank you for coming to see [place] at [facility]. I hope we have been able to provide you high quality care today.  You may receive a patient satisfaction survey over the next few weeks. We would appreciate your feedback and comments so that we may continue to improve ourselves and the health of our patients.

## 2017-04-22 ENCOUNTER — Telehealth: Payer: Self-pay | Admitting: *Deleted

## 2017-04-22 NOTE — Telephone Encounter (Addendum)
Received fax from [place] [facility] for Tysabri discontinuation questionnaire. Advised pt no longer receiving medication and to disregard/dc. Fax: [phone]. Received fax confirmation.  Re-faxed this am with MD signature. 04/23/17.

## 2017-05-22 ENCOUNTER — Telehealth: Payer: Self-pay | Admitting: Neurology

## 2017-05-22 ENCOUNTER — Ambulatory Visit: Payer: BLUE CROSS/BLUE SHIELD | Admitting: Neurology

## 2017-05-22 NOTE — Telephone Encounter (Signed)
This patient canceled same day of a Botox treatment appointment.

## 2017-05-22 NOTE — Telephone Encounter (Signed)
error 

## 2017-05-23 ENCOUNTER — Encounter: Payer: Self-pay | Admitting: Neurology

## 2017-06-20 ENCOUNTER — Ambulatory Visit (INDEPENDENT_AMBULATORY_CARE_PROVIDER_SITE_OTHER): Payer: BLUE CROSS/BLUE SHIELD | Admitting: Neurology

## 2017-06-20 ENCOUNTER — Encounter: Payer: Self-pay | Admitting: Neurology

## 2017-06-20 VITALS — BP 155/92 | HR 86 | Ht 64.0 in | Wt 227.0 lb

## 2017-06-20 DIAGNOSIS — G43711 Chronic migraine without aura, intractable, with status migrainosus: Secondary | ICD-10-CM

## 2017-06-20 MED ORDER — ONABOTULINUMTOXINA 100 UNITS IJ SOLR
200.0000 [IU] | Freq: Once | INTRAMUSCULAR | Status: AC
  Administered 2017-06-20: 200 [IU] via INTRAMUSCULAR

## 2017-06-20 NOTE — [place] Notes (Signed)
Please refer to Botox procedure note. 

## 2017-06-20 NOTE — Procedures (Signed)
     BOTOX PROCEDURE NOTE FOR MIGRAINE HEADACHE   HISTORY: Rhonda Osborne is a 57 year old patient with a history of intractable migraine headaches.  The patient is having about 20 headache days a month, 10-15 of these days are noted to be intractable headaches that resulted in inability to function.  The patient comes in for her first Botox injection today.  She has been on a multitude of medications without benefit.   Description of procedure:  The patient was placed in a sitting position. The standard protocol was used for Botox as follows, with 5 units of Botox injected at each site:   -Procerus muscle, midline injection  -Corrugator muscle, bilateral injection  -Frontalis muscle, bilateral injection, with 2 sites each side, medial injection was performed in the upper one third of the frontalis muscle, in the region vertical from the medial inferior edge of the superior orbital rim. The lateral injection was again in the upper one third of the forehead vertically above the lateral limbus of the cornea, 1.5 cm lateral to the medial injection site.  -Temporalis muscle injection, 4 sites, bilaterally. The first injection was 3 cm above the tragus of the ear, second injection site was 1.5 cm to 3 cm up from the first injection site in line with the tragus of the ear. The third injection site was 1.5-3 cm forward between the first 2 injection sites. The fourth injection site was 1.5 cm posterior to the second injection site.  -Occipitalis muscle injection, 3 sites, bilaterally. The first injection was done one half way between the occipital protuberance and the tip of the mastoid process behind the ear. The second injection site was done lateral and superior to the first, 1 fingerbreadth from the first injection. The third injection site was 1 fingerbreadth superiorly and medially from the first injection site.  -Cervical paraspinal muscle injection, 2 sites, bilateral, the first injection  site was 1 cm from the midline of the cervical spine, 3 cm inferior to the lower border of the occipital protuberance. The second injection site was 1.5 cm superiorly and laterally to the first injection site.  -Trapezius muscle injection was performed at 3 sites, bilaterally. The first injection site was in the upper trapezius muscle halfway between the inflection point of the neck, and the acromion. The second injection site was one half way between the acromion and the first injection site. The third injection was done between the first injection site and the inflection point of the neck.   A 200 unit bottle of Botox was used, 155 units were injected, the rest of the Botox was wasted. The patient tolerated the procedure well, there were no complications of the above procedure.  Botox NDC 2035-5974-16 Lot number [id number] Expiration date July 2021

## 2017-06-21 ENCOUNTER — Other Ambulatory Visit: Payer: Self-pay | Admitting: Neurology

## 2017-07-28 ENCOUNTER — Other Ambulatory Visit: Payer: Self-pay | Admitting: Neurology

## 2017-08-16 ENCOUNTER — Other Ambulatory Visit: Payer: Self-pay | Admitting: Internal Medicine

## 2017-08-16 DIAGNOSIS — K8020 Calculus of gallbladder without cholecystitis without obstruction: Secondary | ICD-10-CM

## 2017-08-16 DIAGNOSIS — R109 Unspecified abdominal pain: Secondary | ICD-10-CM

## 2017-09-10 ENCOUNTER — Telehealth: Payer: Self-pay | Admitting: Neurology

## 2017-09-10 ENCOUNTER — Ambulatory Visit: Payer: BLUE CROSS/BLUE SHIELD | Admitting: Adult Health

## 2017-09-10 NOTE — Telephone Encounter (Signed)
Angie, please send patient dismissal letter.

## 2017-09-10 NOTE — Telephone Encounter (Signed)
Pt no showed for apt

## 2017-09-10 NOTE — Telephone Encounter (Signed)
Patient no showed apt 04/09/17, 05/22/17 and then today's apt on 09/10/17.  This calls for dismissal from practice.

## 2017-09-11 ENCOUNTER — Encounter: Payer: Self-pay | Admitting: Neurology

## 2017-09-16 ENCOUNTER — Ambulatory Visit
Admission: RE | Admit: 2017-09-16 | Discharge: 2017-09-16 | Disposition: A | Discharge status: Home / Self Care | Payer: BLUE CROSS/BLUE SHIELD | Source: Ambulatory Visit | Attending: Internal Medicine | Admitting: Internal Medicine

## 2017-09-16 DIAGNOSIS — R109 Unspecified abdominal pain: Secondary | ICD-10-CM

## 2017-09-16 DIAGNOSIS — K8020 Calculus of gallbladder without cholecystitis without obstruction: Secondary | ICD-10-CM

## 2017-09-19 ENCOUNTER — Ambulatory Visit: Payer: BLUE CROSS/BLUE SHIELD | Admitting: Adult Health

## 2017-09-25 ENCOUNTER — Telehealth: Payer: Self-pay | Admitting: Neurology

## 2017-09-25 NOTE — Telephone Encounter (Signed)
Noted, thank you

## 2017-09-25 NOTE — Telephone Encounter (Signed)
I spoke w/ Rhonda Osborne. Ok to offer today. Dr. Jannifer Franklin had cx. Will be using office stock.  Called husband. Offered today at 130pm w/ Dr. Jannifer Franklin to work her in for a botox appt. He is going to make a quick call to see if they can make this work and call us back.   Advised he can speak w/ phone staff when he calls back.

## 2017-09-25 NOTE — Telephone Encounter (Signed)
This was a duplicate phone note. See other phone note.

## 2017-09-25 NOTE — Telephone Encounter (Signed)
I called and spoke with the patients husband. I made him aware that she had her third no show and of our policy. He said that he was told her apt was moved to 05/23 (there is a previous apt for 05/23 but it was canceled.) he says he never spoke to anyone about moving the apt to 04/16 so they no showed the apt because they did not know there was an apt scheduled for that day.  Terrence Dupont, this patients botox injection was cancelled when she had her 3rd no show. We are not dismissing her and now she needs an apt for botox and is coming up on her due date (she was originally scheduled for tomorrow). Please let me know if there is any way to work her in sooner.

## 2017-09-25 NOTE — Telephone Encounter (Signed)
Per Danton Clap. In phone room: "Pts husband calling requesting a call back to r/s pts botox. Stating she cannot make the work in slot for 1:30 today 5/1. Please call to advise"

## 2017-09-25 NOTE — Telephone Encounter (Signed)
Pts husband calling requesting a call back to r/s pts botox. Stating she cannot make the work in slot for 1:30 today 5/1. Please call to advise

## 2017-09-25 NOTE — Telephone Encounter (Signed)
Called husband back. They could not accept 130pm appt today w/ Dr. Jannifer Franklin. I scheduled appt for 10/08/17 at 730am. He was agreeable to date/time.

## 2017-09-26 ENCOUNTER — Ambulatory Visit: Payer: BLUE CROSS/BLUE SHIELD | Admitting: Neurology

## 2017-10-08 ENCOUNTER — Encounter: Payer: Self-pay | Admitting: Neurology

## 2017-10-08 ENCOUNTER — Ambulatory Visit (INDEPENDENT_AMBULATORY_CARE_PROVIDER_SITE_OTHER): Payer: BLUE CROSS/BLUE SHIELD | Admitting: Neurology

## 2017-10-08 ENCOUNTER — Telehealth: Payer: Self-pay | Admitting: Neurology

## 2017-10-08 VITALS — BP 168/98 | HR 77 | Ht 64.0 in

## 2017-10-08 DIAGNOSIS — G43711 Chronic migraine without aura, intractable, with status migrainosus: Secondary | ICD-10-CM | POA: Diagnosis not present

## 2017-10-08 DIAGNOSIS — R269 Unspecified abnormalities of gait and mobility: Secondary | ICD-10-CM | POA: Insufficient documentation

## 2017-10-08 DIAGNOSIS — R413 Other amnesia: Secondary | ICD-10-CM

## 2017-10-08 DIAGNOSIS — G35 Multiple sclerosis: Secondary | ICD-10-CM | POA: Diagnosis not present

## 2017-10-08 DIAGNOSIS — R202 Paresthesia of skin: Secondary | ICD-10-CM | POA: Diagnosis not present

## 2017-10-08 MED ORDER — ONABOTULINUMTOXINA 100 UNITS IJ SOLR
200.0000 [IU] | Freq: Once | INTRAMUSCULAR | Status: AC
  Administered 2017-10-08: 200 [IU] via INTRAMUSCULAR

## 2017-10-08 MED ORDER — NARATRIPTAN HCL 2.5 MG PO TABS
2.5000 mg | ORAL_TABLET | Freq: Two times a day (BID) | ORAL | 5 refills | Status: DC | PRN
Start: 2017-10-08 — End: 2018-05-03

## 2017-10-08 NOTE — Procedures (Signed)
     BOTOX PROCEDURE NOTE FOR MIGRAINE HEADACHE   HISTORY: Rhonda Osborne is a 57 year old patient with a history of intractable migraine headaches who comes in for her second Botox injection.  She indicates that she had a dramatic improvement in her headaches that lasted about 2-1/2 months following the injection.  The patient is quite pleased with her headache benefit, but her headaches have worsened over the last 2 to 3 weeks prior to this next injection.  The patient comes in for her second Botox therapy.   Description of procedure:  The patient was placed in a sitting position. The standard protocol was used for Botox as follows, with 5 units of Botox injected at each site:   -Procerus muscle, midline injection  -Corrugator muscle, bilateral injection  -Frontalis muscle, bilateral injection, with 2 sites each side, medial injection was performed in the upper one third of the frontalis muscle, in the region vertical from the medial inferior edge of the superior orbital rim. The lateral injection was again in the upper one third of the forehead vertically above the lateral limbus of the cornea, 1.5 cm lateral to the medial injection site.  -Temporalis muscle injection, 4 sites, bilaterally. The first injection was 3 cm above the tragus of the ear, second injection site was 1.5 cm to 3 cm up from the first injection site in line with the tragus of the ear. The third injection site was 1.5-3 cm forward between the first 2 injection sites. The fourth injection site was 1.5 cm posterior to the second injection site.  -Occipitalis muscle injection, 3 sites, bilaterally. The first injection was done one half way between the occipital protuberance and the tip of the mastoid process behind the ear. The second injection site was done lateral and superior to the first, 1 fingerbreadth from the first injection. The third injection site was 1 fingerbreadth superiorly and medially from the first  injection site.  -Cervical paraspinal muscle injection, 2 sites, bilateral, the first injection site was 1 cm from the midline of the cervical spine, 3 cm inferior to the lower border of the occipital protuberance. The second injection site was 1.5 cm superiorly and laterally to the first injection site.  -Trapezius muscle injection was performed at 3 sites, bilaterally. The first injection site was in the upper trapezius muscle halfway between the inflection point of the neck, and the acromion. The second injection site was one half way between the acromion and the first injection site. The third injection was done between the first injection site and the inflection point of the neck.   A 200 unit bottle of Botox was used, 155 units were injected, the rest of the Botox was wasted. The patient tolerated the procedure well, there were no complications of the above procedure.  Botox NDC 2993-7169-67 Lot number [id number] Expiration date September 2021

## 2017-10-08 NOTE — Telephone Encounter (Signed)
MR Brain w/wo contrast Dr. Stephanie Acre Auth: [place] Ref # [id number]. Patient is schedule for 10/22/17 on the [facility] mobile unit.

## 2017-10-08 NOTE — Progress Notes (Signed)
Reason for visit: Gait disorder  Rhonda Osborne is an 57 y.o. female  History of present illness:  Rhonda Osborne is a 57 year old right-handed white female with a history of intractable migraine headaches, fibromyalgia, and a previous diagnosis of multiple sclerosis.  The patient comes in today for Botox therapy, but she reports a new neurologic issue.  The patient has had worsening problems with her gait and gait stability, she has had increased falls over the last several months.  She reports some problems with memory loss and onset of tremor involving the upper extremities.  The patient has a history of nonalcoholic cirrhosis of the liver which is being followed.  The patient in the past has been treated with Tysabri, but lumbar puncture did not confirm the diagnosis of multiple sclerosis and the patient had a single white matter lesion on MRI of the brain.  The patient however believes that her symptoms have gradually worsened, she has some decreased sensation on the right side of the body.  She has gained good improvement with the Botox, her headaches however have worsened over the last 3 weeks.  The patient has no rescue medication to take when her headaches come back on.  Past Medical History:  Diagnosis Date  . Arthritis   . Cancer ([place])    breast cancer-right  . Cellulitis   . Chronic migraine without aura, with intractable migraine, so stated, with status migrainosus 07/24/2016  . Depression   . Family history of adverse reaction to anesthesia    mother age 27, in 65 , was to have exploratory surgery and was given a sedative before starting surgery and found to have unknown heart problem and died from medication she was given before the surgery  . Fibromyalgia   . Headache    migraines  . Hypertension   . Liver cirrhosis [facility])     Past Surgical History:  Procedure Laterality Date  . ABDOMINAL HYSTERECTOMY     left ovaries  . APPENDECTOMY    . BACK SURGERY    . BREAST  SURGERY     right  . CERVICAL SPINE SURGERY     between C5-C6 and C6-C7  . CESAREAN SECTION  1991,1994   x 2  . DILATION AND CURETTAGE OF UTERUS    . ESOPHAGOGASTRODUODENOSCOPY (EGD) WITH PROPOFOL N/A 03/28/2016   Procedure: ESOPHAGOGASTRODUODENOSCOPY (EGD) WITH PROPOFOL;  Surgeon: Arta Silence, MD;  Location: [facility];  Service: Endoscopy;  Laterality: N/A;  . kidney stent      Family History  Problem Relation Age of Onset  . Heart attack Mother   . Lung cancer Mother   . Heart attack Father     Social history:  reports that she has never smoked. She has never used smokeless tobacco. She reports that she does not drink alcohol or use drugs.    Allergies  Allergen Reactions  . Augmentin [Amoxicillin-Pot Clavulanate] Diarrhea  . Flagyl [Metronidazole]     Feeling of throat swelling  . Penicillins Diarrhea  . Sulfa Antibiotics Other (See Comments)    "burning in stomach"  . Tape     Red, itchy,puffy, and redness spread out from tape area    Medications:  Prior to Admission medications   Medication Sig Start Date End Date Taking? Authorizing Provider  amLODipine (NORVASC) 2.5 MG tablet Take 2.5 mg by mouth at bedtime.   Yes [provider]  Cyanocobalamin (VITAMIN B 12 PO) Take 2,500 mg by mouth at bedtime.   Yes  [provider]  diazepam (VALIUM) 5 MG tablet Take 5 mg by mouth at bedtime.   Yes [provider]  DULoxetine (CYMBALTA) 60 MG capsule Take 60 mg by mouth at bedtime.   Yes [provider]  HYDROcodone-acetaminophen (NORCO) 10-325 MG tablet TAKE 1 TABLET BY MOUTH EVERY 6 HOURS AS NEEDED FOR PAIN FOR 30 DAYS 10/10/16  Yes [provider]  letrozole (FEMARA) 2.5 MG tablet Take 2.5 mg by mouth daily. 04/18/16  Yes [provider]  LYRICA 100 MG capsule Take 100 mg by mouth 3 (three) times daily. 06/29/16  Yes [provider]  pentosan polysulfate (ELMIRON) 100 MG capsule Take 100 mg by mouth 2 (two)  times daily. Takes 100 mg in morning and 200 mg at bedtime   Yes [provider]  tiZANidine (ZANAFLEX) 2 MG tablet TAKE 1 TABLET (2 MG TOTAL) BY MOUTH 3 (THREE) TIMES DAILY. 06/21/17  Yes Kathrynn Ducking, MD  Vitamin D, Ergocalciferol, (DRISDOL) 50000 units CAPS capsule Take 50,000 Units by mouth every 7 (seven) days. Takes it on Tuesday every week   Yes [provider]  zonisamide (ZONEGRAN) 100 MG capsule TAKE 1 CAPSULE BY MOUTH TWICE A DAY 07/29/17  Yes Kathrynn Ducking, MD  naratriptan (AMERGE) 2.5 MG tablet Take 1 tablet (2.5 mg total) by mouth 2 (two) times daily as needed for migraine. 10/08/17   Kathrynn Ducking, MD    ROS:  Out of a complete 14 system review of symptoms, the patient complains only of the following symptoms, and all other reviewed systems are negative.  Decreased appetite, chills, fatigue, excessive sweating Ear discharge, hearing loss, ear pain, difficulty swallowing Light sensitivity, double vision, eye pain, blurred vision Cold intolerance, heat intolerance, excessive thirst, flushing Insomnia, daytime sleepiness, sleep talking, acting out dreams Frequent infections Incontinence of the bladder Joint pain, back pain, aching muscles, walking difficulty, neck pain, neck stiffness Skin wounds, rash Memory loss, dizziness, headache, numbness, weakness, tremors Depression, suicidal thoughts  Blood pressure (!) 168/98, pulse 77, height 5\' 4"  (1.626 m).  Physical Exam  General: The patient is alert and cooperative at the time of the examination.  The patient is markedly obese.  Skin: No significant peripheral edema is noted.   Neurologic Exam  Mental status: The patient is alert and oriented x 3 at the time of the examination. The patient has apparent normal recent and remote memory, with an apparently normal attention span and concentration ability.   Cranial nerves: Facial symmetry is present. Speech is normal, no aphasia or dysarthria is  noted. Extraocular movements are full. Visual fields are full.  Pupils are equal, round, and reactive to light.  Discs are flat bilaterally.  Motor: The patient has good strength in all 4 extremities.  Sensory examination: Soft touch sensation is symmetric on the arms, but decreased on the right face and right leg as compared to the left.  Coordination: The patient has good finger-nose-finger and heel-to-shin bilaterally.  Gait and station: The patient has a normal gait.  The patient normally uses a cane for ambulation.  Tandem gait is slightly unsteady. Romberg is negative. No drift is seen.  Reflexes: Deep tendon reflexes are symmetric.   Assessment/Plan:  1.  Intractable migraine headache  2.  History of fibromyalgia  3.  Reports of new symptoms, gait disorder, memory disorder, sensory changes, tremor  The patient has had worsening of her functional levels recently, this will deserve a work-up, MRI of the brain will be  done.  The patient will be given a prescription for Amerge to take if needed for her migraine headaches when the Botox therapy wears off.  The patient will return for her next injection, if MRI evaluation is abnormal, further investigation may be required.  Jill Alexanders MD 10/08/2017 8:02 AM  [facility] [address] [place], [place] [address]  Phone [phone] Fax [phone]

## 2017-10-08 NOTE — Patient Instructions (Signed)
We will use Amerge for the headache if needed.  MRI of the brain will be done.

## 2017-10-10 ENCOUNTER — Telehealth: Payer: Self-pay | Admitting: Neurology

## 2017-10-10 NOTE — Telephone Encounter (Signed)
Error

## 2017-10-15 ENCOUNTER — Ambulatory Visit (INDEPENDENT_AMBULATORY_CARE_PROVIDER_SITE_OTHER): Payer: BLUE CROSS/BLUE SHIELD | Admitting: Family

## 2017-10-15 ENCOUNTER — Encounter: Payer: Self-pay | Admitting: Family

## 2017-10-15 VITALS — BP 140/87 | HR 88 | Temp 98.0°F | Ht 64.0 in | Wt 228.1 lb

## 2017-10-15 DIAGNOSIS — L0291 Cutaneous abscess, unspecified: Secondary | ICD-10-CM | POA: Diagnosis not present

## 2017-10-15 MED ORDER — MUPIROCIN CALCIUM 2 % EX CREA: 1.0000 "application " | TOPICAL_CREAM | Freq: Two times a day (BID) | CUTANEOUS | 0 refills | Status: AC

## 2017-10-15 NOTE — Progress Notes (Signed)
Subjective:    Patient ID: Rhonda Osborne, female    DOB: 03/29/1961, 57 y.o.   MRN: [id number]  Chief Complaint  Patient presents with  . Other    Recurrent skin lesions    HPI:  Rhonda Osborne is a 57 y.o. female presenting today for initial evaluation and treatment of a cutaneous lower extremity abscess.   Rhonda Osborne was last seen by Rhonda Osborne primary care providers on 08/06/17 with recurrent open wounds on Rhonda Osborne mons pubis and low-grade fevers, chills, sweating. She was treated with a ten-day course of doxycycline and given an injection of ceftriaxone with referral to gynecology for further assessment. Gynecology indicated possible infectious disease with no indication for need for treatment with no one present on evaluation.  She believes Rhonda Osborne abscesses started with the placement of a stent related to a kidney stone. Following that placement she was noted to have a sore located in Rhonda Osborne genital area as well as this left side of Rhonda Osborne foot. She has had previous abscesses located in Rhonda Osborne axilla. These were excised and drained with cultures showing normal skin flora. She also believes the breakouts may have been related to surgical preps solutions used to cleanse Rhonda Osborne skin. Ultimately to date she has completed courses of ciprofloxacin, doxycycline, and clindamycin as well as receiving the injection of ceftriaxone once. She currently has no open sores. When she does have a sore/abscess there is discharge described as thin and toothpaste like that is occasionally brown in color. These abscesses, and go. Indicates she continues to have low-grade fevers of 99.   Allergies  Allergen Reactions  . Augmentin [Amoxicillin-Pot Clavulanate] Diarrhea  . Flagyl [Metronidazole]     Feeling of throat swelling  . Penicillins Diarrhea  . Sulfa Antibiotics Other (See Comments)    "burning in stomach"  . Tape     Red, itchy,puffy, and redness spread out from tape area      Outpatient Medications Prior to Visit    Medication Sig Dispense Refill  . amLODipine (NORVASC) 2.5 MG tablet Take 2.5 mg by mouth at bedtime.    . Cyanocobalamin (VITAMIN B 12 PO) Take 2,500 mg by mouth at bedtime.    . diazepam (VALIUM) 5 MG tablet Take 5 mg by mouth at bedtime.    . DULoxetine (CYMBALTA) 60 MG capsule Take 60 mg by mouth at bedtime.    Marland Kitchen HYDROcodone-acetaminophen (NORCO) 10-325 MG tablet TAKE 1 TABLET BY MOUTH EVERY 6 HOURS AS NEEDED FOR PAIN FOR 30 DAYS  0  . letrozole (FEMARA) 2.5 MG tablet Take 2.5 mg by mouth daily.    Marland Kitchen LYRICA 100 MG capsule Take 100 mg by mouth 3 (three) times daily.    . naratriptan (AMERGE) 2.5 MG tablet Take 1 tablet (2.5 mg total) by mouth 2 (two) times daily as needed for migraine. 10 tablet 5  . pentosan polysulfate (ELMIRON) 100 MG capsule Take 100 mg by mouth 2 (two) times daily. Takes 100 mg in morning and 200 mg at bedtime    . tiZANidine (ZANAFLEX) 2 MG tablet TAKE 1 TABLET (2 MG TOTAL) BY MOUTH 3 (THREE) TIMES DAILY. 90 tablet 4  . Vitamin D, Ergocalciferol, (DRISDOL) 50000 units CAPS capsule Take 50,000 Units by mouth every 7 (seven) days. Takes it on Tuesday every week    . zonisamide (ZONEGRAN) 100 MG capsule TAKE 1 CAPSULE BY MOUTH TWICE A DAY (Patient not taking: Reported on 10/15/2017) 60 capsule 3   No facility-administered medications prior to visit.  Past Medical History:  Diagnosis Date  . Arthritis   . Cancer ([place])    breast cancer-right  . Cellulitis   . Chronic migraine without aura, with intractable migraine, so stated, with status migrainosus 07/24/2016  . Depression   . Family history of adverse reaction to anesthesia    mother age 63, in 46 , was to have exploratory surgery and was given a sedative before starting surgery and found to have unknown heart problem and died from medication she was given before the surgery  . Fibromyalgia   . Headache    migraines  . Hypertension   . Liver cirrhosis [facility])       Past Surgical History:  Procedure  Laterality Date  . ABDOMINAL HYSTERECTOMY     left ovaries  . APPENDECTOMY    . BACK SURGERY    . BREAST SURGERY     right  . CERVICAL SPINE SURGERY     between C5-C6 and C6-C7  . CESAREAN SECTION  1991,1994   x 2  . DILATION AND CURETTAGE OF UTERUS    . ESOPHAGOGASTRODUODENOSCOPY (EGD) WITH PROPOFOL N/A 03/28/2016   Procedure: ESOPHAGOGASTRODUODENOSCOPY (EGD) WITH PROPOFOL;  Surgeon: Arta Silence, MD;  [facility];  Service: Endoscopy;  Laterality: N/A;  . kidney stent        Family History  Problem Relation Age of Onset  . Heart attack Mother   . Lung cancer Mother   . Heart attack Father       Social History   Socioeconomic History  . Marital status: Married    Spouse name: Not on file  . Number of children: 2  . Years of education: 77  . Highest education level: Not on file  Occupational History  . Not on file  Social Needs  . Financial resource strain: Not on file  . Food insecurity:    Worry: Not on file    Inability: Not on file  . Transportation needs:    Medical: Not on file    Non-medical: Not on file  Tobacco Use  . Smoking status: Never Smoker  . Smokeless tobacco: Never Used  Substance and Sexual Activity  . Alcohol use: No  . Drug use: No  . Sexual activity: Not on file  Lifestyle  . Physical activity:    Days per week: Not on file    Minutes per session: Not on file  . Stress: Not on file  Relationships  . Social connections:    Talks on phone: Not on file    Gets together: Not on file    Attends religious service: Not on file    Active member of club or organization: Not on file    Attends meetings of clubs or organizations: Not on file    Relationship status: Not on file  . Intimate partner violence:    Fear of current or ex partner: Not on file    Emotionally abused: Not on file    Physically abused: Not on file    Forced sexual activity: Not on file  Other Topics Concern  . Not on file  Social History Narrative    Lives   Caffeine use:       Review of Systems  Constitutional: Positive for fever. Negative for chills, diaphoresis and fatigue.  Respiratory: Negative for cough, chest tightness, shortness of breath and wheezing.   Cardiovascular: Negative for chest pain and leg swelling.  Skin: Positive for rash.       Objective:  BP 140/87   Pulse 88   Temp 98 F (36.7 C)   Ht 5\' 4"  (1.626 m)   Wt 228 lb 1.9 oz (103.5 kg)   SpO2 97%   BMI 39.16 kg/m  Nursing note and vital signs reviewed.  Physical Exam  Constitutional: She is oriented to person, place, and time. She appears well-developed and well-nourished. No distress.  Cardiovascular: Normal rate, regular rhythm, normal heart sounds and intact distal pulses.  Pulmonary/Chest: Effort normal and breath sounds normal.  Neurological: She is alert and oriented to person, place, and time.  Skin: Skin is warm and dry. No rash noted.  Psychiatric: She has a normal mood and affect. Rhonda Osborne behavior is normal. Judgment and thought content normal.       Assessment & Plan:   Problem List Items Addressed This Visit      Musculoskeletal and Integument   Cutaneous abscess - Primary    Rhonda Osborne has had several abscesses over the course of the last 8-9 months. Cultures have shown normal skin flora with no evidence of infection. Question if these are sebaceous-type cysts as they occur primarily in Rhonda Osborne groin and axilla. Colonization with MRSA is a potential, however based on previous cultures does not appear to be the case. No antibiotics are necessary at this time as she does not have any current abscesses or lesions. We'll check blood cultures today based on patient report of fever. Discussed MRSA decolonization procedure with Hibiclens and mupirocin. May also consider diluted bleach bath. Recommend continued follow-up with dermatology for possible biopsy of lesions of present. No obvious signs of infection today. We will plan to see Rhonda Osborne back as  needed or if symptoms worsen.       Relevant Orders   CBC (Completed)   Culture, blood (single)   Culture, blood (single)       I am having Rhonda Osborne start on mupirocin cream. I am also having Rhonda Osborne maintain Rhonda Osborne diazepam, pentosan polysulfate, amLODipine, DULoxetine, Vitamin D (Ergocalciferol), Cyanocobalamin (VITAMIN B 12 PO), letrozole, LYRICA, HYDROcodone-acetaminophen, tiZANidine, zonisamide, and naratriptan.   Meds ordered this encounter  Medications  . mupirocin cream (BACTROBAN) 2 %    Sig: Apply 1 application topically 2 (two) times daily. To the nares    Dispense:  15 g    Refill:  0    Order Specific Question:   Supervising Provider    Answer:   Carlyle Basques [[id number]]     Follow-up: Return if symptoms worsen or fail to improve.  Mauricio Po, [facility]

## 2017-10-15 NOTE — Patient Instructions (Addendum)
Nice to meet you.  We will check your blood work today.  Recommend a MRSA decontamination with Hibiclens and Mupiricin for a minimum of 5 days and up to 15 days.   Continue to follow up with dermatology.   We will follow up with results and follow up office visit pending blood work.

## 2017-10-16 ENCOUNTER — Encounter: Payer: Self-pay | Admitting: Family

## 2017-10-16 DIAGNOSIS — L0291 Cutaneous abscess, unspecified: Secondary | ICD-10-CM | POA: Insufficient documentation

## 2017-10-16 LAB — CBC
HEMATOCRIT: 43.5 % (ref 35.0–45.0)
Hemoglobin: 15.2 g/dL (ref 11.7–15.5)
MCH: 31.4 pg (ref 27.0–33.0)
MCHC: 34.9 g/dL (ref 32.0–36.0)
MCV: 89.9 fL (ref 80.0–100.0)
MPV: 11.5 fL (ref 7.5–12.5)
Platelets: 166 10*3/uL (ref 140–400)
RBC: 4.84 10*6/uL (ref 3.80–5.10)
RDW: 12.9 % (ref 11.0–15.0)
WBC: 7.8 10*3/uL (ref 3.8–10.8)

## 2017-10-16 NOTE — Assessment & Plan Note (Signed)
Mrs. Coppola has had several abscesses over the course of the last 8-9 months. Cultures have shown normal skin flora with no evidence of infection. Question if these are sebaceous-type cysts as they occur primarily in her groin and axilla. Colonization with MRSA is a potential, however based on previous cultures does not appear to be the case. No antibiotics are necessary at this time as she does not have any current abscesses or lesions. We'll check blood cultures today based on patient report of fever. Discussed MRSA decolonization procedure with Hibiclens and mupirocin. May also consider diluted bleach bath. Recommend continued follow-up with dermatology for possible biopsy of lesions of present. No obvious signs of infection today. We will plan to see her back as needed or if symptoms worsen.

## 2017-10-17 ENCOUNTER — Ambulatory Visit: Payer: BLUE CROSS/BLUE SHIELD | Admitting: Adult Health

## 2017-10-22 ENCOUNTER — Ambulatory Visit: Payer: BLUE CROSS/BLUE SHIELD

## 2017-10-22 DIAGNOSIS — G35 Multiple sclerosis: Secondary | ICD-10-CM | POA: Diagnosis not present

## 2017-10-22 DIAGNOSIS — R269 Unspecified abnormalities of gait and mobility: Secondary | ICD-10-CM

## 2017-10-22 LAB — CULTURE BLOOD MANUAL
MICRO NUMBER 7002: 90621635
Micro Number: 90621632
RESULT 123: NO GROWTH
Result: NO GROWTH
Specimen Quality: ADEQUATE
Specimen Quality: ADEQUATE

## 2017-10-22 MED ORDER — GADOPENTETATE DIMEGLUMINE 469.01 MG/ML IV SOLN
20.0000 mL | Freq: Once | INTRAVENOUS | Status: AC | PRN
  Administered 2017-10-22: 20 mL via INTRAVENOUS

## 2017-10-23 ENCOUNTER — Telehealth: Payer: Self-pay | Admitting: Neurology

## 2017-10-23 DIAGNOSIS — R269 Unspecified abnormalities of gait and mobility: Secondary | ICD-10-CM

## 2017-10-23 NOTE — Telephone Encounter (Signed)
I called the patient.  The patient has had MRI of the brain that appears to be unremarkable, 2 small nonspecific white matter lesions are seen, not consistent with multiple sclerosis, nothing that explains her reports of memory problems and balance changes.  She continues to fall on a regular basis, her balance is deteriorating.  Given the findings by MRI of the brain, we will check MRI of the cervical spine.  The patient will cut her diazepam tablet in half, she will cut back on the Lyrica to 100 mg twice daily in hopes of improving her balance.   MRI brain 10/23/17:  IMPRESSION:   MRI brain (with and without) demonstrating: - Small disc protrusion partially visualized at C4-5 level, with potential contact upon the ventral spinal cord.  Metallic artifact from anterior cervical discectomy and fusion noted within C5 vertebral body.  Consider follow-up with MRI of the cervical spine with and without contrast. - There are 2 small periventricular/subcortical nonspecific T2 hyperintensities. - No acute findings.

## 2017-10-24 ENCOUNTER — Telehealth: Payer: Self-pay | Admitting: Neurology

## 2017-10-24 NOTE — Telephone Encounter (Signed)
[employer] [place] Auth: [place] Ref # [id number]. Patient is scheduled for 10/30/17 on the [facility] mobile unit.

## 2017-10-28 ENCOUNTER — Other Ambulatory Visit: Payer: Self-pay | Admitting: Neurology

## 2017-10-30 ENCOUNTER — Ambulatory Visit (INDEPENDENT_AMBULATORY_CARE_PROVIDER_SITE_OTHER): Payer: BLUE CROSS/BLUE SHIELD

## 2017-10-30 ENCOUNTER — Telehealth: Payer: Self-pay | Admitting: Neurology

## 2017-10-30 DIAGNOSIS — R269 Unspecified abnormalities of gait and mobility: Secondary | ICD-10-CM | POA: Diagnosis not present

## 2017-10-30 MED ORDER — GADOPENTETATE DIMEGLUMINE 469.01 MG/ML IV SOLN
20.0000 mL | Freq: Once | INTRAVENOUS | Status: AC | PRN
  Administered 2017-10-30: 20 mL via INTRAVENOUS

## 2017-10-30 NOTE — Telephone Encounter (Signed)
I called the patient, talk with the husband, if the patient does have a severe headache in the future, she can call her office during weekdays and potentially get an infusion for the headache.

## 2017-10-30 NOTE — Telephone Encounter (Signed)
Patient is here today for MRI but would like a call back regarding her migraines and possible IntroFusion apt. Best call back [phone]

## 2017-10-31 ENCOUNTER — Telehealth: Payer: Self-pay | Admitting: Behavioral Health

## 2017-10-31 ENCOUNTER — Telehealth: Payer: Self-pay | Admitting: Neurology

## 2017-10-31 DIAGNOSIS — R269 Unspecified abnormalities of gait and mobility: Secondary | ICD-10-CM

## 2017-10-31 NOTE — Telephone Encounter (Signed)
Called patient and informed her per Terri Piedra NP that her blood cultures are negative and antibiotics are not indicated at this time.  Patient verbalized understanding and was appreciative of the call. Pricilla Riffle RN

## 2017-10-31 NOTE — Telephone Encounter (Signed)
I called the patient.  The MRI of the cervical spine shows area of prior surgery but no evidence of spinal cord compression or evidence of MS affecting the spinal cord.  The patient is still having trouble with walking, we will get her set up for physical therapy.   MRI cervical 10/31/17:  IMPRESSION:   MRI cervical spine (without) demonstrating: - At C4-5, C5-6: disc bulging, uncovertebral joint hypertrophy with mild spinal stenosis and no foraminal stenosis; no cord signal abnormalities. - Anterior cervical discectomy and fusion with metal hardware at C5, C6 and C7.

## 2017-10-31 NOTE — Telephone Encounter (Signed)
-----   Message from [place], [place] sent at 10/24/2017  3:56 PM EDT ----- Please inform patient that her blood cultures are negative with no antibiotics indicated at this time.

## 2017-11-15 ENCOUNTER — Telehealth: Payer: Self-pay | Admitting: Neurology

## 2017-11-15 ENCOUNTER — Other Ambulatory Visit: Payer: Self-pay | Admitting: Neurology

## 2017-11-15 NOTE — Telephone Encounter (Signed)
It appears that this pt was supposed to be dismissed, per a phone not from 09/10/17. Do you still want to refill the tizanidine?

## 2017-11-15 NOTE — Telephone Encounter (Signed)
This patient apparently did no-show on 3 occasions within the last 12 months, she did not show on 10 September 2017, 22 May 2017, and 09 April 2017.  After her third no-show, apparently no letter was sent out, the patient has been seen back in the office for revisit in May 2019.  She is now asking for refills, I suppose we need to monitor this, another no-show will result in a discharge from our practice.

## 2018-01-10 ENCOUNTER — Telehealth: Payer: Self-pay | Admitting: Neurology

## 2018-01-10 ENCOUNTER — Ambulatory Visit (INDEPENDENT_AMBULATORY_CARE_PROVIDER_SITE_OTHER): Payer: BLUE CROSS/BLUE SHIELD | Admitting: Neurology

## 2018-01-10 ENCOUNTER — Encounter: Payer: Self-pay | Admitting: Neurology

## 2018-01-10 VITALS — BP 130/88 | HR 91 | Ht 64.0 in | Wt 226.0 lb

## 2018-01-10 DIAGNOSIS — G43711 Chronic migraine without aura, intractable, with status migrainosus: Secondary | ICD-10-CM | POA: Diagnosis not present

## 2018-01-10 MED ORDER — ONABOTULINUMTOXINA 100 UNITS IJ SOLR
200.0000 [IU] | Freq: Once | INTRAMUSCULAR | Status: AC
  Administered 2018-01-10: 200 [IU] via INTRAMUSCULAR

## 2018-01-10 NOTE — Telephone Encounter (Signed)
Pt. Needs 12 wk Botox inj

## 2018-01-10 NOTE — [place] Notes (Signed)
Please refer to Botox procedure note. 

## 2018-01-10 NOTE — Procedures (Signed)
     BOTOX PROCEDURE NOTE FOR MIGRAINE HEADACHE   HISTORY: Rhonda Osborne is a 57 year old patient with a history of intractable migraine headache.  She comes in for her third Botox injection.  She has indicated a dramatic improvement in her migraines, she essentially has eliminated her headaches and this benefit has continued until the injection today.  The patient is quite pleased with her response to treatment.   Description of procedure:  The patient was placed in a sitting position. The standard protocol was used for Botox as follows, with 5 units of Botox injected at each site:   -Procerus muscle, midline injection  -Corrugator muscle, bilateral injection  -Frontalis muscle, bilateral injection, with 2 sites each side, medial injection was performed in the upper one third of the frontalis muscle, in the region vertical from the medial inferior edge of the superior orbital rim. The lateral injection was again in the upper one third of the forehead vertically above the lateral limbus of the cornea, 1.5 cm lateral to the medial injection site.  -Temporalis muscle injection, 4 sites, bilaterally. The first injection was 3 cm above the tragus of the ear, second injection site was 1.5 cm to 3 cm up from the first injection site in line with the tragus of the ear. The third injection site was 1.5-3 cm forward between the first 2 injection sites. The fourth injection site was 1.5 cm posterior to the second injection site.  -Occipitalis muscle injection, 3 sites, bilaterally. The first injection was done one half way between the occipital protuberance and the tip of the mastoid process behind the ear. The second injection site was done lateral and superior to the first, 1 fingerbreadth from the first injection. The third injection site was 1 fingerbreadth superiorly and medially from the first injection site.  -Cervical paraspinal muscle injection, 2 sites, bilateral, the first injection site  was 1 cm from the midline of the cervical spine, 3 cm inferior to the lower border of the occipital protuberance. The second injection site was 1.5 cm superiorly and laterally to the first injection site.  -Trapezius muscle injection was performed at 3 sites, bilaterally. The first injection site was in the upper trapezius muscle halfway between the inflection point of the neck, and the acromion. The second injection site was one half way between the acromion and the first injection site. The third injection was done between the first injection site and the inflection point of the neck.   A 200 unit bottle of Botox was used, 155 units were injected, the rest of the Botox was wasted. The patient tolerated the procedure well, there were no complications of the above procedure.  Botox NDC 8250-5397-67 Lot number [id number] Expiration date March 2022

## 2018-01-13 ENCOUNTER — Ambulatory Visit: Payer: BLUE CROSS/BLUE SHIELD | Admitting: Neurology

## 2018-01-13 NOTE — Telephone Encounter (Signed)
I called to schedule the patient but she did not answer, I left a VM asking her to call me back.

## 2018-03-07 ENCOUNTER — Other Ambulatory Visit: Payer: Self-pay | Admitting: Gastroenterology

## 2018-03-07 DIAGNOSIS — K7469 Other cirrhosis of liver: Secondary | ICD-10-CM

## 2018-03-18 ENCOUNTER — Other Ambulatory Visit: Payer: Self-pay | Admitting: Neurology

## 2018-04-08 ENCOUNTER — Ambulatory Visit
Admission: RE | Admit: 2018-04-08 | Discharge: 2018-04-08 | Disposition: A | Discharge status: Home / Self Care | Payer: BLUE CROSS/BLUE SHIELD | Source: Ambulatory Visit | Attending: Gastroenterology | Admitting: Gastroenterology

## 2018-04-08 DIAGNOSIS — K7469 Other cirrhosis of liver: Secondary | ICD-10-CM

## 2018-04-11 ENCOUNTER — Encounter: Payer: Self-pay | Admitting: Neurology

## 2018-04-11 ENCOUNTER — Ambulatory Visit (INDEPENDENT_AMBULATORY_CARE_PROVIDER_SITE_OTHER): Payer: BLUE CROSS/BLUE SHIELD | Admitting: Neurology

## 2018-04-11 VITALS — BP 143/87 | HR 86 | Ht 64.0 in | Wt 234.0 lb

## 2018-04-11 DIAGNOSIS — G43711 Chronic migraine without aura, intractable, with status migrainosus: Secondary | ICD-10-CM

## 2018-04-11 MED ORDER — TRIAMCINOLONE ACETONIDE 0.1 % EX OINT: 1.0000 "application " | TOPICAL_OINTMENT | Freq: Two times a day (BID) | CUTANEOUS | 0 refills | Status: DC

## 2018-04-11 MED ORDER — ONDANSETRON HCL 8 MG PO TABS: 8.0000 mg | ORAL_TABLET | Freq: Three times a day (TID) | ORAL | 0 refills | Status: AC | PRN

## 2018-04-11 MED ORDER — ONABOTULINUMTOXINA 100 UNITS IJ SOLR
200.0000 [IU] | Freq: Once | INTRAMUSCULAR | Status: AC
  Administered 2018-04-11: 200 [IU] via INTRAMUSCULAR

## 2018-04-11 NOTE — Progress Notes (Signed)
The patient has developed a contact dermatitis on the left forearm.  A prescription for triamcinolone ointment was sent in.

## 2018-04-11 NOTE — Procedures (Signed)
     BOTOX PROCEDURE NOTE FOR MIGRAINE HEADACHE   HISTORY: Rhonda Osborne is a 57 year old patient with a history of intractable migraine headache.  The patient has done well with the Botox, she recently began having headaches 2 weeks prior to this injection, this is her fourth injection.  She has had some nausea and vomiting with the headaches.  She returns for her Botox therapy.   Description of procedure:  The patient was placed in a sitting position. The standard protocol was used for Botox as follows, with 5 units of Botox injected at each site:   -Procerus muscle, midline injection  -Corrugator muscle, bilateral injection  -Frontalis muscle, bilateral injection, with 2 sites each side, medial injection was performed in the upper one third of the frontalis muscle, in the region vertical from the medial inferior edge of the superior orbital rim. The lateral injection was again in the upper one third of the forehead vertically above the lateral limbus of the cornea, 1.5 cm lateral to the medial injection site.  -Temporalis muscle injection, 4 sites, bilaterally. The first injection was 3 cm above the tragus of the ear, second injection site was 1.5 cm to 3 cm up from the first injection site in line with the tragus of the ear. The third injection site was 1.5-3 cm forward between the first 2 injection sites. The fourth injection site was 1.5 cm posterior to the second injection site.  -Occipitalis muscle injection, 3 sites, bilaterally. The first injection was done one half way between the occipital protuberance and the tip of the mastoid process behind the ear. The second injection site was done lateral and superior to the first, 1 fingerbreadth from the first injection. The third injection site was 1 fingerbreadth superiorly and medially from the first injection site.  -Cervical paraspinal muscle injection, 2 sites, bilateral, the first injection site was 1 cm from the midline of the  cervical spine, 3 cm inferior to the lower border of the occipital protuberance. The second injection site was 1.5 cm superiorly and laterally to the first injection site.  -Trapezius muscle injection was performed at 3 sites, bilaterally. The first injection site was in the upper trapezius muscle halfway between the inflection point of the neck, and the acromion. The second injection site was one half way between the acromion and the first injection site. The third injection was done between the first injection site and the inflection point of the neck.   A 200 unit bottle of Botox was used, 155 units were injected, the rest of the Botox was wasted. The patient tolerated the procedure well, there were no complications of the above procedure.  Botox NDC 5456-2563-89 Lot number [id number] Expiration date April 2022

## 2018-05-03 ENCOUNTER — Other Ambulatory Visit: Payer: Self-pay | Admitting: Neurology

## 2018-07-08 ENCOUNTER — Other Ambulatory Visit: Payer: Self-pay | Admitting: Neurology

## 2018-07-16 ENCOUNTER — Encounter: Payer: Self-pay | Admitting: Neurology

## 2018-07-16 ENCOUNTER — Telehealth: Payer: Self-pay | Admitting: Neurology

## 2018-07-16 ENCOUNTER — Ambulatory Visit (INDEPENDENT_AMBULATORY_CARE_PROVIDER_SITE_OTHER): Payer: BLUE CROSS/BLUE SHIELD | Admitting: Neurology

## 2018-07-16 VITALS — BP 141/83 | HR 95 | Ht 64.0 in | Wt 239.0 lb

## 2018-07-16 DIAGNOSIS — G43711 Chronic migraine without aura, intractable, with status migrainosus: Secondary | ICD-10-CM

## 2018-07-16 MED ORDER — ONABOTULINUMTOXINA 100 UNITS IJ SOLR
200.0000 [IU] | Freq: Once | INTRAMUSCULAR | Status: AC
  Administered 2018-07-16: 200 [IU] via INTRAMUSCULAR

## 2018-07-16 NOTE — Procedures (Signed)
     BOTOX PROCEDURE NOTE FOR MIGRAINE HEADACHE   HISTORY: Rhonda Osborne is a 58 year old patient with a history of intractable migraine headaches.  She comes in for her fifth Botox injection, she continues to have an excellent benefit from the medication, she will have virtually no headaches until about 2 weeks prior to the next injection.  She is quite pleased with the results from her Botox therapy.   Description of procedure:  The patient was placed in a sitting position. The standard protocol was used for Botox as follows, with 5 units of Botox injected at each site:   -Procerus muscle, midline injection  -Corrugator muscle, bilateral injection  -Frontalis muscle, bilateral injection, with 2 sites each side, medial injection was performed in the upper one third of the frontalis muscle, in the region vertical from the medial inferior edge of the superior orbital rim. The lateral injection was again in the upper one third of the forehead vertically above the lateral limbus of the cornea, 1.5 cm lateral to the medial injection site.  -Temporalis muscle injection, 4 sites, bilaterally. The first injection was 3 cm above the tragus of the ear, second injection site was 1.5 cm to 3 cm up from the first injection site in line with the tragus of the ear. The third injection site was 1.5-3 cm forward between the first 2 injection sites. The fourth injection site was 1.5 cm posterior to the second injection site.  -Occipitalis muscle injection, 3 sites, bilaterally. The first injection was done one half way between the occipital protuberance and the tip of the mastoid process behind the ear. The second injection site was done lateral and superior to the first, 1 fingerbreadth from the first injection. The third injection site was 1 fingerbreadth superiorly and medially from the first injection site.  -Cervical paraspinal muscle injection, 2 sites, bilateral, the first injection site was 1 cm from  the midline of the cervical spine, 3 cm inferior to the lower border of the occipital protuberance. The second injection site was 1.5 cm superiorly and laterally to the first injection site.  -Trapezius muscle injection was performed at 3 sites, bilaterally. The first injection site was in the upper trapezius muscle halfway between the inflection point of the neck, and the acromion. The second injection site was one half way between the acromion and the first injection site. The third injection was done between the first injection site and the inflection point of the neck.   A 200 unit bottle of Botox was used, 155 units were injected, the rest of the Botox was wasted. The patient tolerated the procedure well, there were no complications of the above procedure.  Botox NDC 2423-5361-44 Lot number: [id number] Expiration date August 2022

## 2018-07-16 NOTE — Telephone Encounter (Signed)
3 mo Botox inj  °

## 2018-07-22 NOTE — Telephone Encounter (Signed)
I called and scheduled with her husband (ok per DPR). DW

## 2018-10-02 ENCOUNTER — Other Ambulatory Visit: Payer: Self-pay | Admitting: Neurology

## 2018-10-17 ENCOUNTER — Other Ambulatory Visit: Payer: Self-pay | Admitting: Gastroenterology

## 2018-10-22 ENCOUNTER — Telehealth: Payer: Self-pay | Admitting: Neurology

## 2018-10-22 ENCOUNTER — Other Ambulatory Visit: Payer: Self-pay | Admitting: Gastroenterology

## 2018-10-22 ENCOUNTER — Ambulatory Visit (INDEPENDENT_AMBULATORY_CARE_PROVIDER_SITE_OTHER): Payer: BLUE CROSS/BLUE SHIELD | Admitting: Neurology

## 2018-10-22 ENCOUNTER — Other Ambulatory Visit: Payer: Self-pay

## 2018-10-22 ENCOUNTER — Encounter: Payer: Self-pay | Admitting: Neurology

## 2018-10-22 VITALS — BP 180/110 | HR 110 | Temp 97.2°F

## 2018-10-22 DIAGNOSIS — K7460 Unspecified cirrhosis of liver: Secondary | ICD-10-CM

## 2018-10-22 DIAGNOSIS — G43711 Chronic migraine without aura, intractable, with status migrainosus: Secondary | ICD-10-CM

## 2018-10-22 MED ORDER — ONABOTULINUMTOXINA 100 UNITS IJ SOLR
200.0000 [IU] | Freq: Once | INTRAMUSCULAR | Status: AC
  Administered 2018-10-22: 13:00:00 200 [IU] via INTRAMUSCULAR

## 2018-10-22 NOTE — Telephone Encounter (Signed)
I called to schedule the patient but she did not answer so I left a VM asking her to call back. If she calls back please confirm that September apt will work with her.

## 2018-10-22 NOTE — Procedures (Signed)
     BOTOX PROCEDURE NOTE FOR MIGRAINE HEADACHE   HISTORY: Rhonda Osborne is a 58 year old patient with a history of intractable migraine headache.  She has been under increased stress recently as her husband has lost his job.  She has had almost daily headaches of the last month or 2.  The patient follows up for her Botox therapy today.  Previously, she had gotten significant benefit from the medication.   Description of procedure:  The patient was placed in a sitting position. The standard protocol was used for Botox as follows, with 5 units of Botox injected at each site:   -Procerus muscle, midline injection  -Corrugator muscle, bilateral injection  -Frontalis muscle, bilateral injection, with 2 sites each side, medial injection was performed in the upper one third of the frontalis muscle, in the region vertical from the medial inferior edge of the superior orbital rim. The lateral injection was again in the upper one third of the forehead vertically above the lateral limbus of the cornea, 1.5 cm lateral to the medial injection site.  -Temporalis muscle injection, 4 sites, bilaterally. The first injection was 3 cm above the tragus of the ear, second injection site was 1.5 cm to 3 cm up from the first injection site in line with the tragus of the ear. The third injection site was 1.5-3 cm forward between the first 2 injection sites. The fourth injection site was 1.5 cm posterior to the second injection site.  -Occipitalis muscle injection, 3 sites, bilaterally. The first injection was done one half way between the occipital protuberance and the tip of the mastoid process behind the ear. The second injection site was done lateral and superior to the first, 1 fingerbreadth from the first injection. The third injection site was 1 fingerbreadth superiorly and medially from the first injection site.  -Cervical paraspinal muscle injection, 2 sites, bilateral, the first injection site was 1 cm  from the midline of the cervical spine, 3 cm inferior to the lower border of the occipital protuberance. The second injection site was 1.5 cm superiorly and laterally to the first injection site.  -Trapezius muscle injection was performed at 3 sites, bilaterally. The first injection site was in the upper trapezius muscle halfway between the inflection point of the neck, and the acromion. The second injection site was one half way between the acromion and the first injection site. The third injection was done between the first injection site and the inflection point of the neck.   A 200 unit bottle of Botox was used, 155 units were injected, the rest of the Botox was wasted. The patient tolerated the procedure well, there were no complications of the above procedure.  Botox NDC 4680-3212-24 Lot number [id number] Expiration date May 2022

## 2018-10-22 NOTE — [place] Notes (Signed)
Please refer to Botox procedure note. 

## 2018-10-22 NOTE — Telephone Encounter (Signed)
3 mo f/u botox inj

## 2018-11-06 ENCOUNTER — Other Ambulatory Visit: Payer: Self-pay

## 2018-11-06 ENCOUNTER — Ambulatory Visit
Admission: RE | Admit: 2018-11-06 | Discharge: 2018-11-06 | Disposition: A | Discharge status: Home / Self Care | Payer: BC Managed Care – PPO | Source: Ambulatory Visit | Attending: Gastroenterology | Admitting: Gastroenterology

## 2018-11-06 DIAGNOSIS — K7460 Unspecified cirrhosis of liver: Secondary | ICD-10-CM

## 2019-01-29 ENCOUNTER — Ambulatory Visit (INDEPENDENT_AMBULATORY_CARE_PROVIDER_SITE_OTHER): Payer: BC Managed Care – PPO | Admitting: Neurology

## 2019-01-29 ENCOUNTER — Other Ambulatory Visit: Payer: Self-pay

## 2019-01-29 ENCOUNTER — Ambulatory Visit
Admission: RE | Admit: 2019-01-29 | Discharge: 2019-01-29 | Disposition: A | Discharge status: Home / Self Care | Payer: BC Managed Care – PPO | Source: Ambulatory Visit | Attending: Neurology | Admitting: Neurology

## 2019-01-29 ENCOUNTER — Encounter: Payer: Self-pay | Admitting: Neurology

## 2019-01-29 VITALS — BP 156/88 | HR 112 | Temp 96.9°F | Wt 252.0 lb

## 2019-01-29 DIAGNOSIS — M25511 Pain in right shoulder: Secondary | ICD-10-CM

## 2019-01-29 DIAGNOSIS — G43711 Chronic migraine without aura, intractable, with status migrainosus: Secondary | ICD-10-CM | POA: Diagnosis not present

## 2019-01-29 DIAGNOSIS — M5442 Lumbago with sciatica, left side: Secondary | ICD-10-CM

## 2019-01-29 MED ORDER — ONABOTULINUMTOXINA 100 UNITS IJ SOLR
200.0000 [IU] | Freq: Once | INTRAMUSCULAR | Status: AC
  Administered 2019-01-29: 13:00:00 200 [IU] via INTRAMUSCULAR

## 2019-01-29 NOTE — Procedures (Signed)
     BOTOX PROCEDURE NOTE FOR MIGRAINE HEADACHE   HISTORY: Rhonda Osborne is a 58 year old patient with a history of intractable migraine headache.  The patient has done quite well on the Botox, her headache benefit extended up until 1 week prior to this injection.  Her headaches have began become quite severe.  She is coming in for a Botox therapy session.   Description of procedure:  The patient was placed in a sitting position. The standard protocol was used for Botox as follows, with 5 units of Botox injected at each site:   -Procerus muscle, midline injection  -Corrugator muscle, bilateral injection  -Frontalis muscle, bilateral injection, with 2 sites each side, medial injection was performed in the upper one third of the frontalis muscle, in the region vertical from the medial inferior edge of the superior orbital rim. The lateral injection was again in the upper one third of the forehead vertically above the lateral limbus of the cornea, 1.5 cm lateral to the medial injection site.  -Temporalis muscle injection, 4 sites, bilaterally. The first injection was 3 cm above the tragus of the ear, second injection site was 1.5 cm to 3 cm up from the first injection site in line with the tragus of the ear. The third injection site was 1.5-3 cm forward between the first 2 injection sites. The fourth injection site was 1.5 cm posterior to the second injection site.  -Occipitalis muscle injection, 3 sites, bilaterally. The first injection was done one half way between the occipital protuberance and the tip of the mastoid process behind the ear. The second injection site was done lateral and superior to the first, 1 fingerbreadth from the first injection. The third injection site was 1 fingerbreadth superiorly and medially from the first injection site.  -Cervical paraspinal muscle injection, 2 sites, bilateral, the first injection site was 1 cm from the midline of the cervical spine, 3 cm  inferior to the lower border of the occipital protuberance. The second injection site was 1.5 cm superiorly and laterally to the first injection site.  -Trapezius muscle injection was performed at 3 sites, bilaterally. The first injection site was in the upper trapezius muscle halfway between the inflection point of the neck, and the acromion. The second injection site was one half way between the acromion and the first injection site. The third injection was done between the first injection site and the inflection point of the neck.   A 200 unit bottle of Botox was used, 155 units were injected, the rest of the Botox was wasted. The patient tolerated the procedure well, there were no complications of the above procedure.  Botox NDC [id number] Lot number [id number] Expiration date March 2023

## 2019-01-29 NOTE — Progress Notes (Signed)
The patient comes in today, she has had 2 recent falls with injuries to the right shoulder and to the low back, she has pain going down the left leg.  We will get a revisit set up to evaluate her walking issues.  She has done well with the Botox, she had significant benefit with her headaches until 1 week prior to this injection.  She comes in for another Botox therapy.

## 2019-01-30 ENCOUNTER — Telehealth: Payer: Self-pay | Admitting: Neurology

## 2019-01-30 NOTE — Telephone Encounter (Signed)
I called the patient.  The x-ray of the shoulder on the right shows no fracture, may be changes consistent with a rotator cuff issue, some AC joint arthritis.  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  If the right shoulder pain continues, may consider orthopedic referral.   XR shoulder 01/29/19:  IMPRESSION: 1. No acute fracture or traumatic malalignment. Suspect small effusion. 2. Degenerative changes of the glenohumeral and acromioclavicular joints. 3. Slightly high-riding appearance of the humeral head may reflect some rotator cuff insufficiency.    XR lumbar 01/29/19:  IMPRESSION: Multilevel discogenic and facet degenerative changes throughout the spine. No definite acute osseous abnormality. Please note: Spine radiography has limited sensitivity and specificity in the setting of significant trauma, even more so given the extensive degenerative changes and scoliotic curvature. If there is significant mechanism, recommend low threshold for CT imaging.

## 2019-02-05 ENCOUNTER — Telehealth: Payer: Self-pay

## 2019-02-05 NOTE — Telephone Encounter (Signed)
-----   Message from Kathrynn Ducking, MD sent at 01/29/2019 12:39 PM EDT ----- Please set up a working revisit for this patient sometime in the next couple weeks or so.  Thank you.

## 2019-02-05 NOTE — Telephone Encounter (Signed)
I contacted the pt's husband and he and I were able to schedule pt's f.u visit.  Pt scheduled for 02/17/2019 at 12 pm. I advised to arrive at 11:30 and to wear a mask.

## 2019-02-17 ENCOUNTER — Ambulatory Visit: Payer: Self-pay | Admitting: Neurology

## 2019-02-19 ENCOUNTER — Telehealth: Payer: Self-pay

## 2019-02-19 NOTE — Telephone Encounter (Signed)
I called to schedule the patient for her Botox injection but she did not answer, I left a VM asking her to call us back, when she calls back please confirm the apt date and time work. DW

## 2019-03-11 ENCOUNTER — Other Ambulatory Visit: Payer: Self-pay | Admitting: Physician Assistant

## 2019-03-11 DIAGNOSIS — R936 Abnormal findings on diagnostic imaging of limbs: Secondary | ICD-10-CM

## 2019-03-22 ENCOUNTER — Other Ambulatory Visit: Payer: Self-pay | Admitting: Neurology

## 2019-03-28 ENCOUNTER — Other Ambulatory Visit: Payer: Self-pay

## 2019-03-28 ENCOUNTER — Ambulatory Visit
Admission: RE | Admit: 2019-03-28 | Discharge: 2019-03-28 | Disposition: A | Discharge status: Home / Self Care | Payer: BC Managed Care – PPO | Source: Ambulatory Visit | Attending: Physician Assistant | Admitting: Physician Assistant

## 2019-03-28 DIAGNOSIS — R936 Abnormal findings on diagnostic imaging of limbs: Secondary | ICD-10-CM

## 2019-05-04 ENCOUNTER — Encounter: Payer: Self-pay | Admitting: Neurology

## 2019-05-04 ENCOUNTER — Ambulatory Visit (INDEPENDENT_AMBULATORY_CARE_PROVIDER_SITE_OTHER): Payer: BC Managed Care – PPO | Admitting: Neurology

## 2019-05-04 ENCOUNTER — Other Ambulatory Visit: Payer: Self-pay

## 2019-05-04 VITALS — BP 175/95 | HR 116 | Temp 97.3°F

## 2019-05-04 DIAGNOSIS — G43711 Chronic migraine without aura, intractable, with status migrainosus: Secondary | ICD-10-CM

## 2019-05-04 DIAGNOSIS — G3281 Cerebellar ataxia in diseases classified elsewhere: Secondary | ICD-10-CM

## 2019-05-04 DIAGNOSIS — R269 Unspecified abnormalities of gait and mobility: Secondary | ICD-10-CM

## 2019-05-04 DIAGNOSIS — Z5181 Encounter for therapeutic drug level monitoring: Secondary | ICD-10-CM

## 2019-05-04 MED ORDER — ONABOTULINUMTOXINA 100 UNITS IJ SOLR
200.0000 [IU] | Freq: Once | INTRAMUSCULAR | Status: AC
  Administered 2019-05-04: 200 [IU] via INTRAMUSCULAR

## 2019-05-04 NOTE — Progress Notes (Signed)
Please refer to Botox procedure note.   The patient comes in today with several other issues.  She has recently had a fall and injured her right shoulder, she is seen orthopedic surgery for this.  She had a recent injection of steroids in the last 7 to 10 days.  Blood pressure today is quite elevated, a recheck showed a blood pressure of 218/120.  Patient will call her primary care physician, she will take an extra Norvasc tablet when she gets home today.  The patient reports that she has had ongoing worsening of balance.  Prior evaluation did not show changes on the MRI of the brain and cervical spine that were typical for multiple sclerosis, but she had been treated with Tysabri from her prior neurologist.  Lumbar puncture did not show features consistent with multiple sclerosis.  The patient also has urinary incontinence, she is being considered for a InterStim stimulator.  The patient will be set up for MRI of the brain and cervical spine to reevaluate for demyelinating disease.  The patient will undergo physical therapy for gait training, she is also in physical therapy for increased mobility of her right shoulder.  She uses a cane for ambulation, she is falling even with a cane, she has a tendency to go backwards.  The recent blood pressure elevation may be related to the recent steroid injection, but this does need to be dealt with soon.

## 2019-05-04 NOTE — Procedures (Signed)
     BOTOX PROCEDURE NOTE FOR MIGRAINE HEADACHE   HISTORY: Rhonda Osborne is a 58 year old patient with a history of intractable migraine headache.  The patient has done quite well with her migraines, she has only had 2 severe headaches since last seen, the headaches may last up to 2 days.  She is quite pleased with the results of the Botox therapy and comes in for another treatment.   Description of procedure:  The patient was placed in a sitting position. The standard protocol was used for Botox as follows, with 5 units of Botox injected at each site:   -Procerus muscle, midline injection  -Corrugator muscle, bilateral injection  -Frontalis muscle, bilateral injection, with 2 sites each side, medial injection was performed in the upper one third of the frontalis muscle, in the region vertical from the medial inferior edge of the superior orbital rim. The lateral injection was again in the upper one third of the forehead vertically above the lateral limbus of the cornea, 1.5 cm lateral to the medial injection site.  -Temporalis muscle injection, 4 sites, bilaterally. The first injection was 3 cm above the tragus of the ear, second injection site was 1.5 cm to 3 cm up from the first injection site in line with the tragus of the ear. The third injection site was 1.5-3 cm forward between the first 2 injection sites. The fourth injection site was 1.5 cm posterior to the second injection site.  -Occipitalis muscle injection, 3 sites, bilaterally. The first injection was done one half way between the occipital protuberance and the tip of the mastoid process behind the ear. The second injection site was done lateral and superior to the first, 1 fingerbreadth from the first injection. The third injection site was 1 fingerbreadth superiorly and medially from the first injection site.  -Cervical paraspinal muscle injection, 2 sites, bilateral, the first injection site was 1 cm from the midline of the  cervical spine, 3 cm inferior to the lower border of the occipital protuberance. The second injection site was 1.5 cm superiorly and laterally to the first injection site.  -Trapezius muscle injection was performed at 3 sites, bilaterally. The first injection site was in the upper trapezius muscle halfway between the inflection point of the neck, and the acromion. The second injection site was one half way between the acromion and the first injection site. The third injection was done between the first injection site and the inflection point of the neck.   A 200 unit bottle of Botox was used, 155 units were injected, the rest of the Botox was wasted. The patient tolerated the procedure well, there were no complications of the above procedure.  Botox NDC [id number] Lot number [id number] Expiration date August 2023

## 2019-05-06 ENCOUNTER — Telehealth: Payer: Self-pay

## 2019-05-06 NOTE — Telephone Encounter (Signed)
Noted, thank you

## 2019-05-06 NOTE — Telephone Encounter (Signed)
Called pt to inform her of her next appt time and date and had to LVM with detailed information on VM [phone] as per DPR. Office number left as well if she had any questions or was needing to change appt.

## 2019-05-06 NOTE — Telephone Encounter (Signed)
Patient has been scheduled please make her aware of apt date and time. DW

## 2019-05-07 ENCOUNTER — Telehealth: Payer: Self-pay

## 2019-05-07 LAB — COMPREHENSIVE METABOLIC PANEL
ALT: 35 IU/L — ABNORMAL HIGH (ref 0–32)
AST: 29 IU/L (ref 0–40)
Albumin/Globulin Ratio: 1.9 (ref 1.2–2.2)
Albumin: 4.7 g/dL (ref 3.8–4.9)
Alkaline Phosphatase: 53 IU/L (ref 39–117)
BUN/Creatinine Ratio: 12 (ref 9–23)
BUN: 9 mg/dL (ref 6–24)
Bilirubin Total: 0.9 mg/dL (ref 0.0–1.2)
CO2: 24 mmol/L (ref 20–29)
Calcium: 10.2 mg/dL (ref 8.7–10.2)
Chloride: 103 mmol/L (ref 96–106)
Creatinine, Ser: 0.78 mg/dL (ref 0.57–1.00)
GFR calc Af Amer: 97 mL/min/{1.73_m2} (ref >59)
GFR calc non Af Amer: 84 mL/min/{1.73_m2} (ref >59)
Globulin, Total: 2.5 g/dL (ref 1.5–4.5)
Glucose: 118 mg/dL — ABNORMAL HIGH (ref 65–99)
Potassium: 3.8 mmol/L (ref 3.5–5.2)
Sodium: 146 mmol/L — ABNORMAL HIGH (ref 134–144)
Total Protein: 7.2 g/dL (ref 6.0–8.5)

## 2019-05-07 LAB — VITAMIN B12: Vitamin B-12: >2000 pg/mL — ABNORMAL HIGH (ref 232–1245)

## 2019-05-07 LAB — COPPER, SERUM: Copper: 142 ug/dL (ref 72–166)

## 2019-05-07 NOTE — Telephone Encounter (Signed)
Lvm ( ok per dpr) advising of results. Pt advised to call back if she had any questions.

## 2019-05-07 NOTE — Telephone Encounter (Signed)
-----   Message from Kathrynn Ducking, MD sent at 05/07/2019  7:12 AM EST ----- Blood work is relatively unremarkable exception of a slightly elevated sodium level and slightly elevated ALT level, of no clinical concern, will follow.

## 2019-07-08 ENCOUNTER — Other Ambulatory Visit: Payer: Self-pay

## 2019-07-08 ENCOUNTER — Telehealth: Payer: Self-pay

## 2019-07-08 MED ORDER — NARATRIPTAN HCL 2.5 MG PO TABS: 2.5000 mg | ORAL_TABLET | Freq: Two times a day (BID) | ORAL | 6 refills | Status: DC | PRN

## 2019-07-08 NOTE — Telephone Encounter (Signed)
Patient husband called to inform that the patient has still been having 2-3 headaches even with the botox  And were wanting to know what the next step would be    Please follow up

## 2019-07-08 NOTE — Telephone Encounter (Signed)
I also advise to make sure pt stays hydrated at all times and get plenty of rest. He verbalized understanding.

## 2019-07-08 NOTE — Telephone Encounter (Signed)
I called pts husband on dpr. He stated pt is still having 2-3 headaches weekly while being on botox. Pt has been getting botox for over a year. Pt was taking amerge for prn when a headaches come on. He stated pt does not have any more pills and that the amerge did help headaches for break through. I stated a refill will be sent in for amerge and a message will be sent to Dr.Willis if he wants to start her on daily medication or other recommendations. The husband verbalized understanding.

## 2019-07-08 NOTE — Telephone Encounter (Signed)
I called, left a message.  I injected the patient with Botox she said that she was doing quite well with the Botox, she was very pleased with the response she was getting.  If she is no longer wanting to continue the Botox, we could try Aimovig or Ajovy, I do not see from the records that she has ever tried these medications.  If they wish to try 1 of these medications, they are to call our office and let us know.

## 2019-08-12 ENCOUNTER — Other Ambulatory Visit: Payer: Self-pay

## 2019-08-12 ENCOUNTER — Ambulatory Visit (INDEPENDENT_AMBULATORY_CARE_PROVIDER_SITE_OTHER): Payer: Medicare Other | Admitting: Neurology

## 2019-08-12 ENCOUNTER — Encounter: Payer: Self-pay | Admitting: Neurology

## 2019-08-12 VITALS — BP 147/88 | HR 99 | Temp 97.0°F | Wt 248.0 lb

## 2019-08-12 DIAGNOSIS — G43711 Chronic migraine without aura, intractable, with status migrainosus: Secondary | ICD-10-CM | POA: Diagnosis not present

## 2019-08-12 MED ORDER — ONABOTULINUMTOXINA 100 UNITS IJ SOLR
200.0000 [IU] | Freq: Once | INTRAMUSCULAR | Status: AC
  Administered 2019-08-12: 200 [IU] via INTRAMUSCULAR

## 2019-08-12 NOTE — Procedures (Signed)
     BOTOX PROCEDURE NOTE FOR MIGRAINE HEADACHE   HISTORY: Rhonda Osborne is a 59 year old patient with a history of intractable migraine headaches.  She has not done well since last seen, the last Botox injection did not offer significant benefit as previous injections have done.  She returns for another therapy at this time.    Description of procedure:  The patient was placed in a sitting position. The standard protocol was used for Botox as follows, with 5 units of Botox injected at each site:   -Procerus muscle, midline injection  -Corrugator muscle, bilateral injection  -Frontalis muscle, bilateral injection, with 2 sites each side, medial injection was performed in the upper one third of the frontalis muscle, in the region vertical from the medial inferior edge of the superior orbital rim. The lateral injection was again in the upper one third of the forehead vertically above the lateral limbus of the cornea, 1.5 cm lateral to the medial injection site.  -Temporalis muscle injection, 4 sites, bilaterally. The first injection was 3 cm above the tragus of the ear, second injection site was 1.5 cm to 3 cm up from the first injection site in line with the tragus of the ear. The third injection site was 1.5-3 cm forward between the first 2 injection sites. The fourth injection site was 1.5 cm posterior to the second injection site.  -Occipitalis muscle injection, 3 sites, bilaterally. The first injection was done one half way between the occipital protuberance and the tip of the mastoid process behind the ear. The second injection site was done lateral and superior to the first, 1 fingerbreadth from the first injection. The third injection site was 1 fingerbreadth superiorly and medially from the first injection site.  -Cervical paraspinal muscle injection, 2 sites, bilateral, the first injection site was 1 cm from the midline of the cervical spine, 3 cm inferior to the lower border of the  occipital protuberance. The second injection site was 1.5 cm superiorly and laterally to the first injection site.  -Trapezius muscle injection was performed at 3 sites, bilaterally. The first injection site was in the upper trapezius muscle halfway between the inflection point of the neck, and the acromion. The second injection site was one half way between the acromion and the first injection site. The third injection was done between the first injection site and the inflection point of the neck.   A 200 unit bottle of Botox was used, 155 units were injected, the rest of the Botox was wasted. The patient tolerated the procedure well, there were no complications of the above procedure.  Botox NDC [id number] Lot number [id number] Expiration date November 2023

## 2019-08-12 NOTE — Progress Notes (Signed)
Botox 100 units lot number [id number] expires 03/2022 Botox 100 units lot number [id number] expires 03/2022  [id number] Buy and bill

## 2019-09-12 ENCOUNTER — Other Ambulatory Visit: Payer: Self-pay | Admitting: Neurology

## 2019-11-19 ENCOUNTER — Ambulatory Visit: Payer: Medicare Other | Admitting: Neurology

## 2019-12-05 IMAGING — CR DG SHOULDER 2+V*R*
3 series · 3 of 3 positions shown · non-contrast
Comparison: None.

CLINICAL DATA: Fall, right shoulder pain

EXAM:
RIGHT SHOULDER - 2+ VIEW

[w shoulder grashey right (1 of 2)]
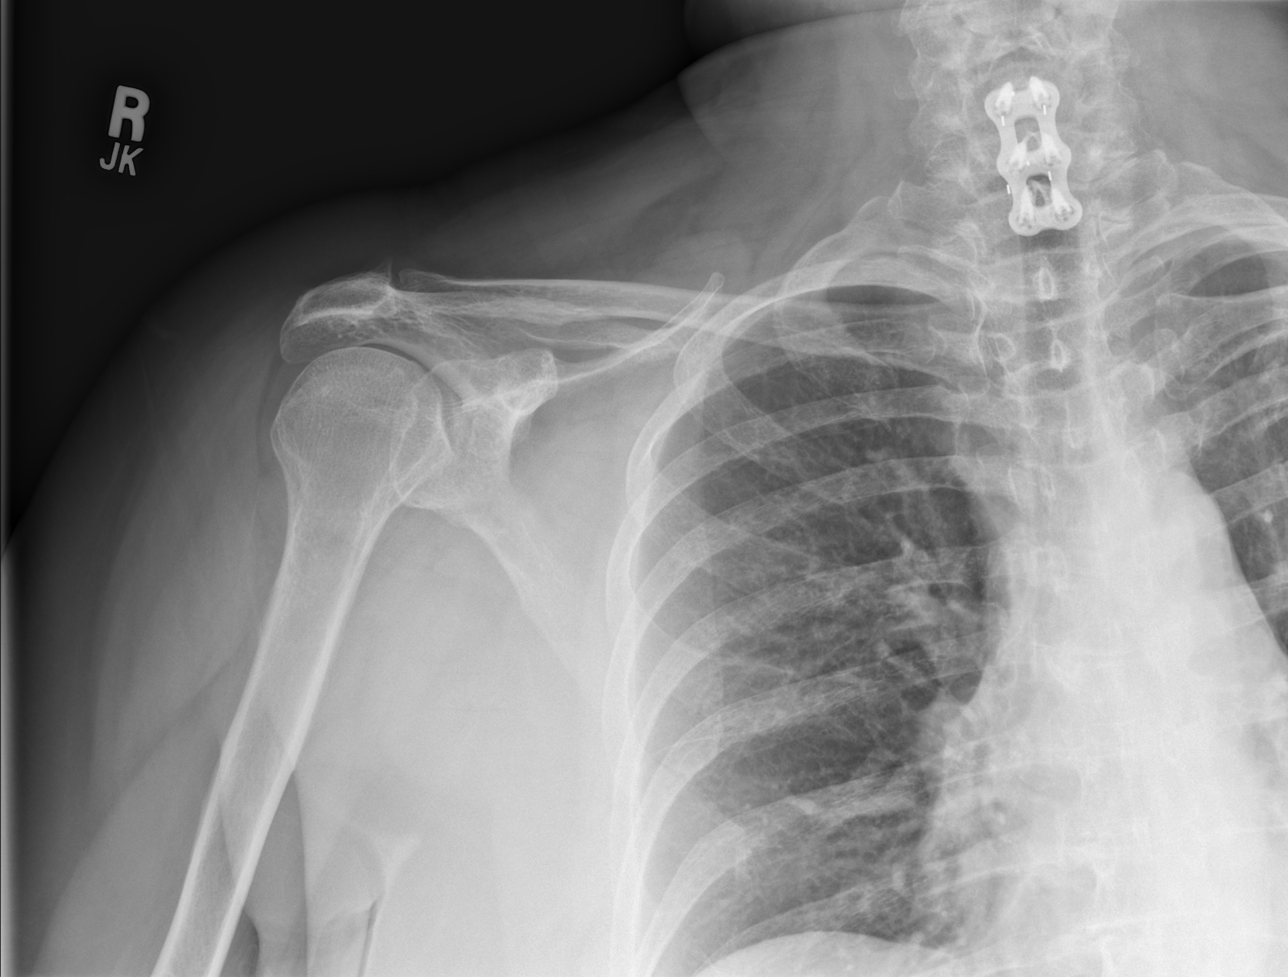

[w shoulder grashey right (2 of 2)]
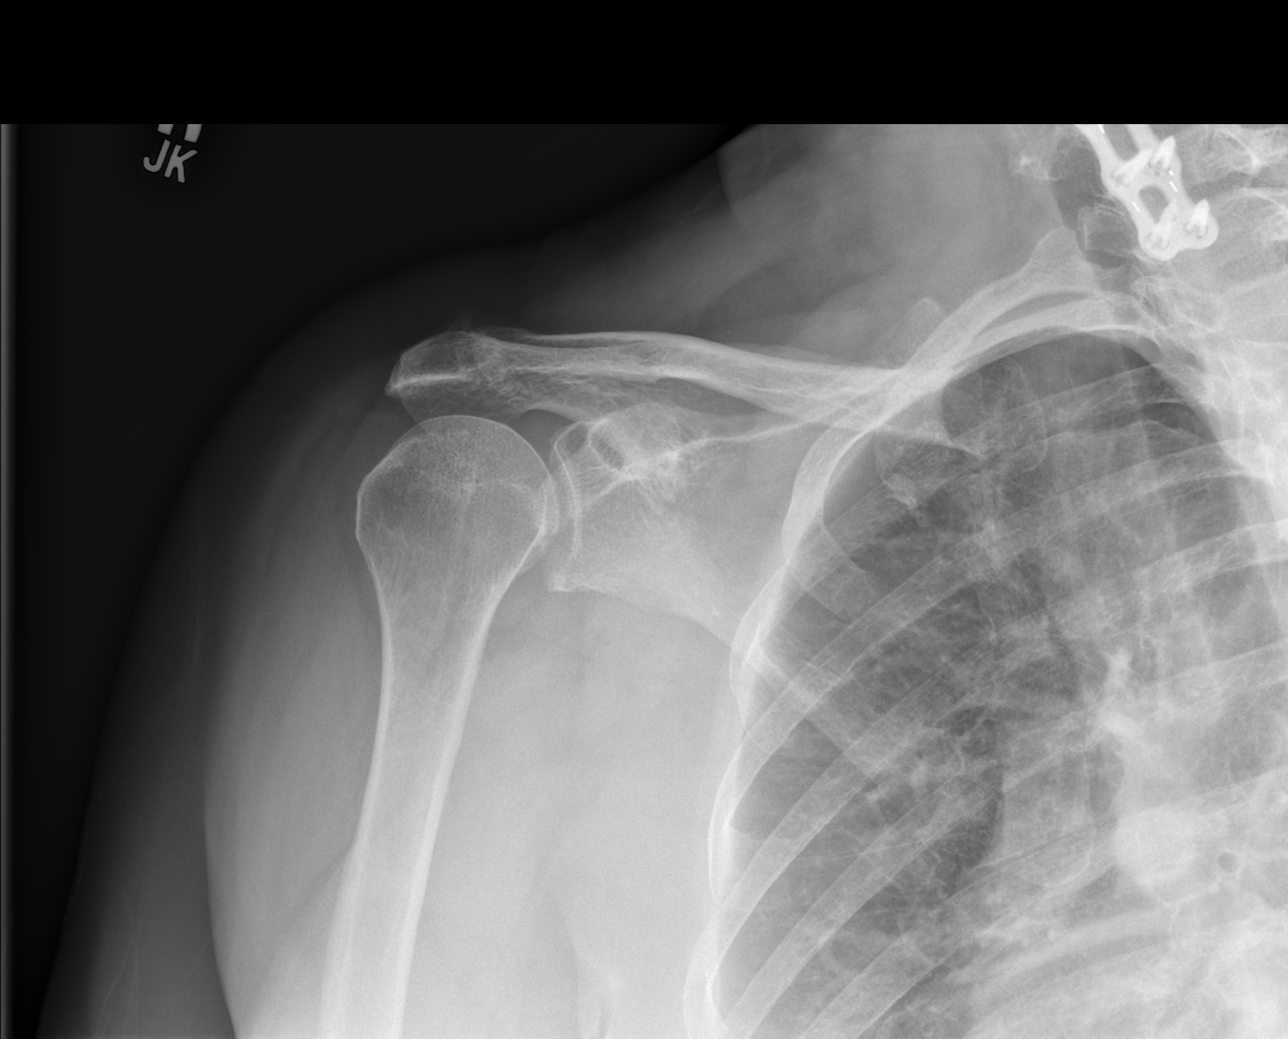

[w shoulder y-view right]
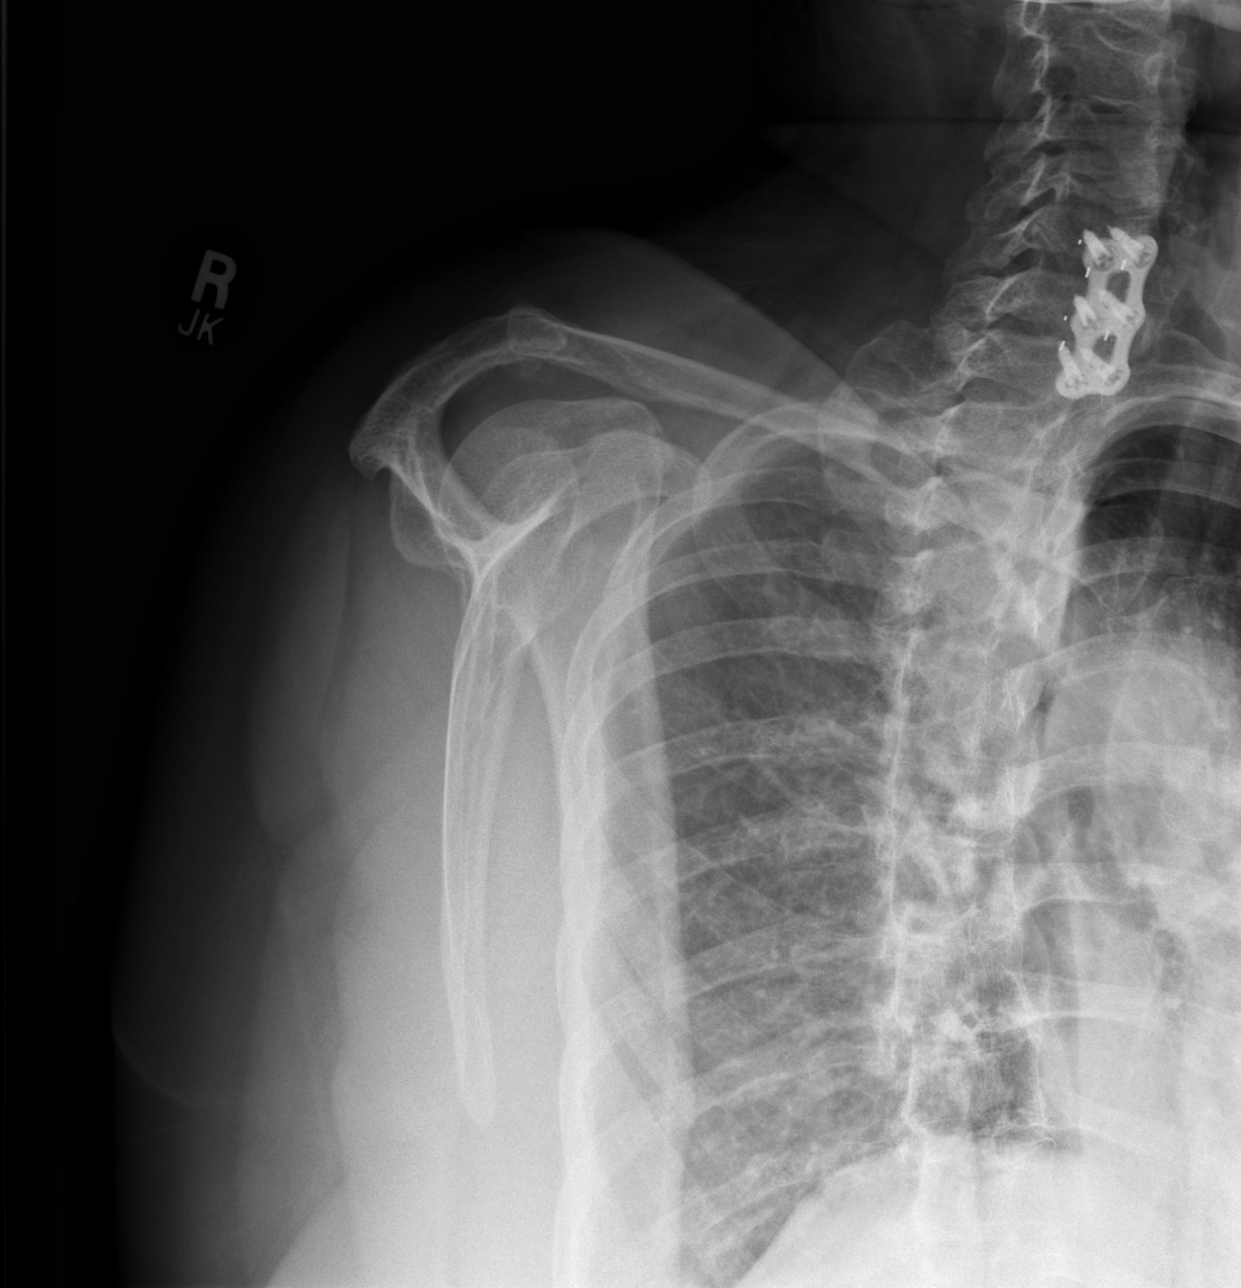

[3 of 3 positions shown; findings below may reference images not displayed]

FINDINGS: No acute fracture or traumatic malalignment. Slightly high-riding
appearance of the humeral head may reflect some rotator cuff
insufficiency. There are degenerative changes of the glenohumeral
and acromioclavicular joint, the latter of which demonstrates some
inferiorly directed spurring. Slightly expanded appearance of the
joint capsule may suggest a trace effusion.

Included portion of the right lung and chest wall are unremarkable.
Prior cervical fusion hardware and interbody spacers are noted.
IMPRESSION: 1. No acute fracture or traumatic malalignment. Suspect small
effusion.
2. Degenerative changes of the glenohumeral and acromioclavicular
joints.
3. Slightly high-riding appearance of the humeral head may reflect
some rotator cuff insufficiency.

## 2019-12-05 IMAGING — CR DG LUMBAR SPINE 2-3V
3 series · 3 of 3 positions shown · non-contrast
Comparison: None.

CLINICAL DATA: Fall, bilateral back pain, left leg pain

EXAM:
LUMBAR SPINE - 2-3 VIEW

[w lumbar spine ap]
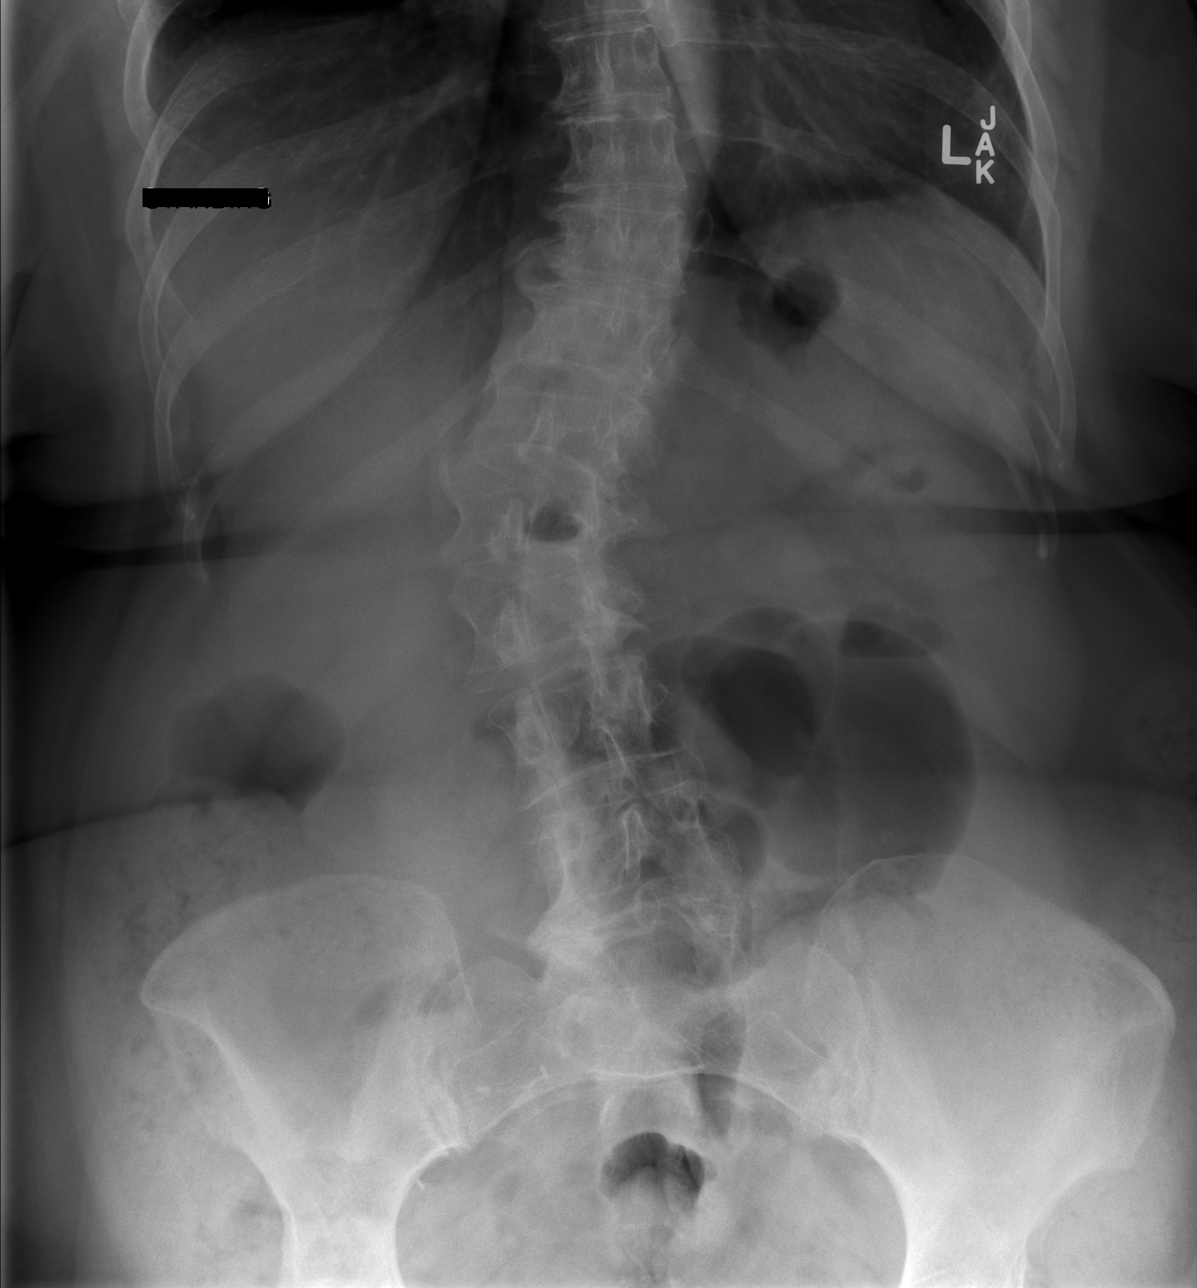

[w lumbar spine lat]
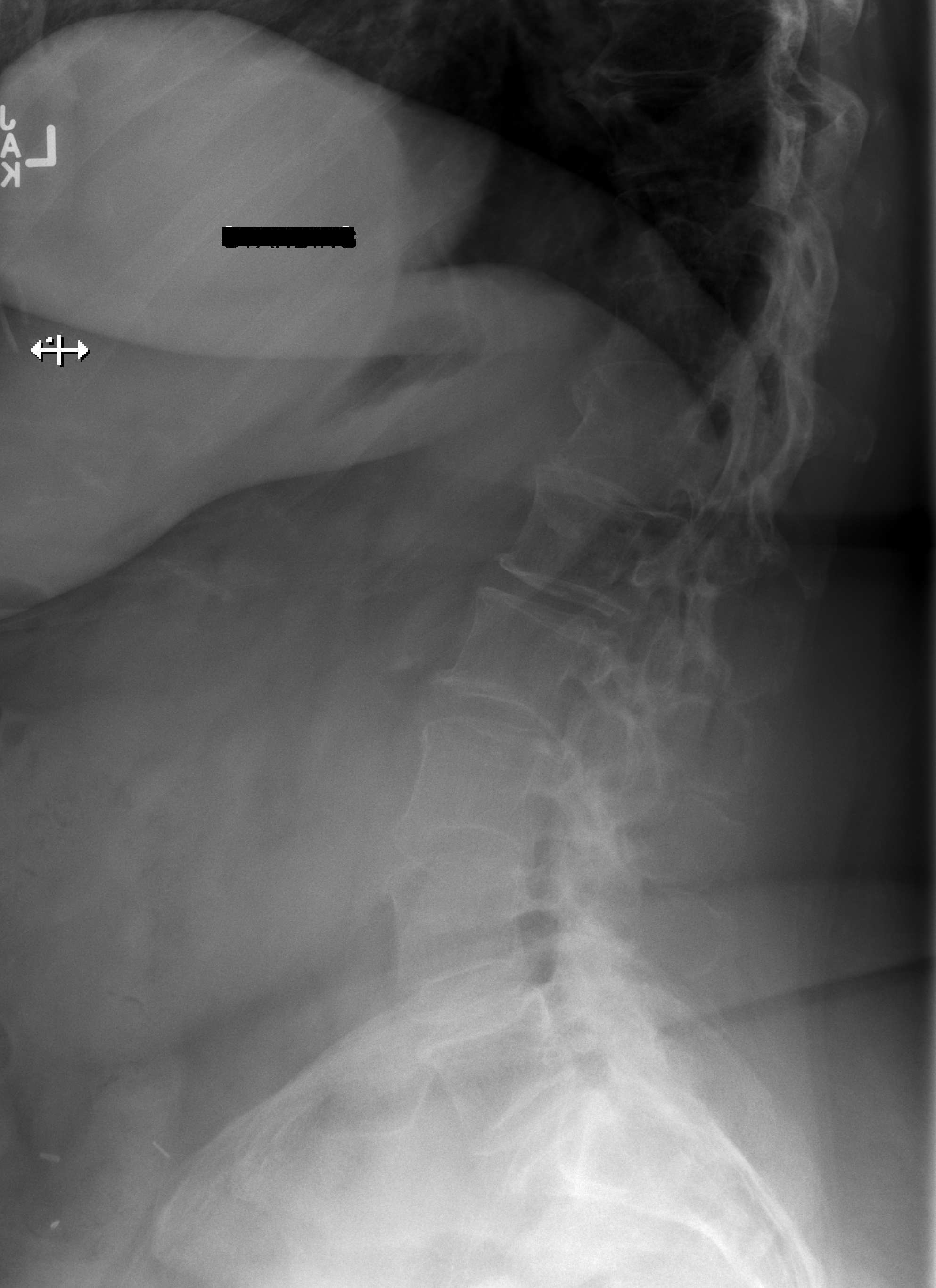

[w lumbar l-5 s-1 spot]
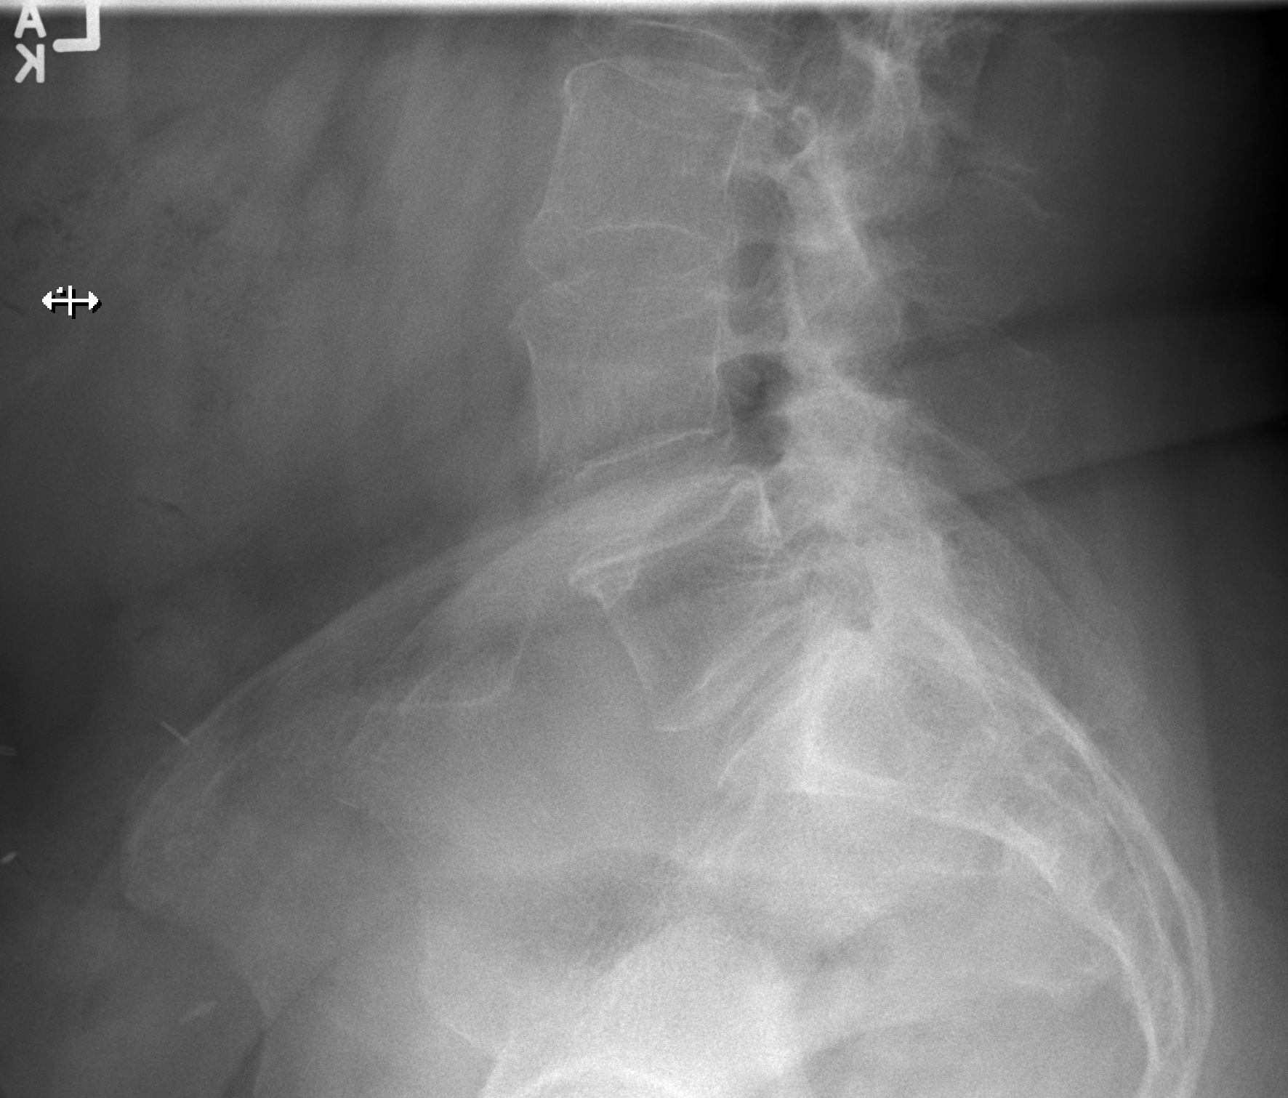

[3 of 3 positions shown; findings below may reference images not displayed]

FINDINGS: There are 6 lumbar type vertebral bodies with marked dextrocurvature
of the spine centered at the L2 level. Multilevel discogenic and
facet degenerative changes are present throughout the spine which
may limit detection of subtle fracture or injury. No definite
vertebral body height loss or traumatic listhesis is evident.
Additional degenerative changes are noted in the SI joints. Bowel
gas pattern is unremarkable. The lung bases are clear.
IMPRESSION: Multilevel discogenic and facet degenerative changes throughout the
spine. No definite acute osseous abnormality. Please note: Spine
radiography has limited sensitivity and specificity in the setting
of significant trauma, even more so given the extensive degenerative
changes and scoliotic curvature. If there is significant mechanism,
recommend low threshold for CT imaging.

## 2020-01-25 ENCOUNTER — Other Ambulatory Visit: Payer: Self-pay | Admitting: Neurology

## 2020-03-08 ENCOUNTER — Other Ambulatory Visit: Payer: Self-pay | Admitting: Neurology

## 2020-06-09 DIAGNOSIS — H00014 Hordeolum externum left upper eyelid: Secondary | ICD-10-CM | POA: Diagnosis not present

## 2020-06-10 DIAGNOSIS — C50511 Malignant neoplasm of lower-outer quadrant of right female breast: Secondary | ICD-10-CM | POA: Diagnosis not present

## 2020-06-10 DIAGNOSIS — Z79811 Long term (current) use of aromatase inhibitors: Secondary | ICD-10-CM | POA: Diagnosis not present

## 2020-06-10 DIAGNOSIS — I890 Lymphedema, not elsewhere classified: Secondary | ICD-10-CM | POA: Diagnosis not present

## 2020-06-10 DIAGNOSIS — Z170 Estrogen receptor positive status [ER+]: Secondary | ICD-10-CM | POA: Diagnosis not present

## 2020-06-10 DIAGNOSIS — M8589 Other specified disorders of bone density and structure, multiple sites: Secondary | ICD-10-CM | POA: Diagnosis not present

## 2020-06-10 DIAGNOSIS — Z5181 Encounter for therapeutic drug level monitoring: Secondary | ICD-10-CM | POA: Diagnosis not present

## 2020-06-10 DIAGNOSIS — Z1231 Encounter for screening mammogram for malignant neoplasm of breast: Secondary | ICD-10-CM | POA: Diagnosis not present

## 2020-06-10 DIAGNOSIS — G35 Multiple sclerosis: Secondary | ICD-10-CM | POA: Diagnosis not present

## 2020-06-10 DIAGNOSIS — F332 Major depressive disorder, recurrent severe without psychotic features: Secondary | ICD-10-CM | POA: Diagnosis not present

## 2020-06-10 DIAGNOSIS — K7469 Other cirrhosis of liver: Secondary | ICD-10-CM | POA: Diagnosis not present

## 2020-06-10 DIAGNOSIS — N6489 Other specified disorders of breast: Secondary | ICD-10-CM | POA: Diagnosis not present

## 2020-06-10 DIAGNOSIS — I10 Essential (primary) hypertension: Secondary | ICD-10-CM | POA: Diagnosis not present

## 2020-08-10 DIAGNOSIS — H524 Presbyopia: Secondary | ICD-10-CM | POA: Diagnosis not present

## 2020-08-29 DIAGNOSIS — M797 Fibromyalgia: Secondary | ICD-10-CM | POA: Diagnosis not present

## 2020-08-29 DIAGNOSIS — I10 Essential (primary) hypertension: Secondary | ICD-10-CM | POA: Diagnosis not present

## 2020-08-29 DIAGNOSIS — G4700 Insomnia, unspecified: Secondary | ICD-10-CM | POA: Diagnosis not present

## 2020-08-29 DIAGNOSIS — G35 Multiple sclerosis: Secondary | ICD-10-CM | POA: Diagnosis not present

## 2020-09-15 DIAGNOSIS — H0259 Other disorders affecting eyelid function: Secondary | ICD-10-CM | POA: Diagnosis not present

## 2020-09-15 DIAGNOSIS — H02886 Meibomian gland dysfunction of left eye, unspecified eyelid: Secondary | ICD-10-CM | POA: Diagnosis not present

## 2020-09-15 DIAGNOSIS — H0100A Unspecified blepharitis right eye, upper and lower eyelids: Secondary | ICD-10-CM | POA: Diagnosis not present

## 2020-09-15 DIAGNOSIS — H0288B Meibomian gland dysfunction left eye, upper and lower eyelids: Secondary | ICD-10-CM | POA: Diagnosis not present

## 2020-09-15 DIAGNOSIS — H0288A Meibomian gland dysfunction right eye, upper and lower eyelids: Secondary | ICD-10-CM | POA: Diagnosis not present

## 2020-09-15 DIAGNOSIS — H0100B Unspecified blepharitis left eye, upper and lower eyelids: Secondary | ICD-10-CM | POA: Diagnosis not present

## 2020-09-15 DIAGNOSIS — Z79899 Other long term (current) drug therapy: Secondary | ICD-10-CM | POA: Diagnosis not present

## 2020-09-15 DIAGNOSIS — H02883 Meibomian gland dysfunction of right eye, unspecified eyelid: Secondary | ICD-10-CM | POA: Diagnosis not present

## 2020-09-15 DIAGNOSIS — H00014 Hordeolum externum left upper eyelid: Secondary | ICD-10-CM | POA: Diagnosis not present

## 2020-09-15 DIAGNOSIS — H00016 Hordeolum externum left eye, unspecified eyelid: Secondary | ICD-10-CM | POA: Diagnosis not present

## 2020-09-15 DIAGNOSIS — H25813 Combined forms of age-related cataract, bilateral: Secondary | ICD-10-CM | POA: Diagnosis not present

## 2021-01-17 DIAGNOSIS — Z1231 Encounter for screening mammogram for malignant neoplasm of breast: Secondary | ICD-10-CM | POA: Diagnosis not present

## 2021-01-17 DIAGNOSIS — M8588 Other specified disorders of bone density and structure, other site: Secondary | ICD-10-CM | POA: Diagnosis not present

## 2021-01-17 DIAGNOSIS — M8589 Other specified disorders of bone density and structure, multiple sites: Secondary | ICD-10-CM | POA: Diagnosis not present

## 2021-01-17 DIAGNOSIS — M85851 Other specified disorders of bone density and structure, right thigh: Secondary | ICD-10-CM | POA: Diagnosis not present

## 2021-01-27 DIAGNOSIS — Z170 Estrogen receptor positive status [ER+]: Secondary | ICD-10-CM | POA: Diagnosis not present

## 2021-01-27 DIAGNOSIS — Z5111 Encounter for antineoplastic chemotherapy: Secondary | ICD-10-CM | POA: Diagnosis not present

## 2021-01-27 DIAGNOSIS — M8589 Other specified disorders of bone density and structure, multiple sites: Secondary | ICD-10-CM | POA: Diagnosis not present

## 2021-01-27 DIAGNOSIS — C50511 Malignant neoplasm of lower-outer quadrant of right female breast: Secondary | ICD-10-CM | POA: Diagnosis not present

## 2021-05-18 DIAGNOSIS — M79604 Pain in right leg: Secondary | ICD-10-CM | POA: Diagnosis not present

## 2021-05-18 DIAGNOSIS — I10 Essential (primary) hypertension: Secondary | ICD-10-CM | POA: Diagnosis not present

## 2021-05-18 DIAGNOSIS — L988 Other specified disorders of the skin and subcutaneous tissue: Secondary | ICD-10-CM | POA: Diagnosis not present

## 2021-05-18 DIAGNOSIS — T889XXA Complication of surgical and medical care, unspecified, initial encounter: Secondary | ICD-10-CM | POA: Diagnosis not present

## 2021-05-18 DIAGNOSIS — M79605 Pain in left leg: Secondary | ICD-10-CM | POA: Diagnosis not present

## 2021-08-03 DIAGNOSIS — I890 Lymphedema, not elsewhere classified: Secondary | ICD-10-CM | POA: Diagnosis not present

## 2021-08-03 DIAGNOSIS — M8589 Other specified disorders of bone density and structure, multiple sites: Secondary | ICD-10-CM | POA: Diagnosis not present

## 2021-08-03 DIAGNOSIS — Z1231 Encounter for screening mammogram for malignant neoplasm of breast: Secondary | ICD-10-CM | POA: Diagnosis not present

## 2021-08-03 DIAGNOSIS — Z170 Estrogen receptor positive status [ER+]: Secondary | ICD-10-CM | POA: Diagnosis not present

## 2021-08-03 DIAGNOSIS — Z9189 Other specified personal risk factors, not elsewhere classified: Secondary | ICD-10-CM | POA: Diagnosis not present

## 2021-08-03 DIAGNOSIS — Z79811 Long term (current) use of aromatase inhibitors: Secondary | ICD-10-CM | POA: Diagnosis not present

## 2021-08-03 DIAGNOSIS — I10 Essential (primary) hypertension: Secondary | ICD-10-CM | POA: Diagnosis not present

## 2021-08-03 DIAGNOSIS — Z6841 Body Mass Index (BMI) 40.0 and over, adult: Secondary | ICD-10-CM | POA: Diagnosis not present

## 2021-08-03 DIAGNOSIS — C50511 Malignant neoplasm of lower-outer quadrant of right female breast: Secondary | ICD-10-CM | POA: Diagnosis not present

## 2021-08-03 DIAGNOSIS — K7469 Other cirrhosis of liver: Secondary | ICD-10-CM | POA: Diagnosis not present

## 2021-08-03 DIAGNOSIS — G35 Multiple sclerosis: Secondary | ICD-10-CM | POA: Diagnosis not present

## 2021-08-14 DIAGNOSIS — Z170 Estrogen receptor positive status [ER+]: Secondary | ICD-10-CM | POA: Diagnosis not present

## 2021-08-14 DIAGNOSIS — C50511 Malignant neoplasm of lower-outer quadrant of right female breast: Secondary | ICD-10-CM | POA: Diagnosis not present

## 2021-09-19 DIAGNOSIS — I10 Essential (primary) hypertension: Secondary | ICD-10-CM | POA: Diagnosis not present

## 2021-09-19 DIAGNOSIS — G8929 Other chronic pain: Secondary | ICD-10-CM | POA: Diagnosis not present

## 2021-09-19 DIAGNOSIS — G4700 Insomnia, unspecified: Secondary | ICD-10-CM | POA: Diagnosis not present

## 2021-09-19 DIAGNOSIS — F329 Major depressive disorder, single episode, unspecified: Secondary | ICD-10-CM | POA: Diagnosis not present

## 2022-01-31 DIAGNOSIS — R0602 Shortness of breath: Secondary | ICD-10-CM | POA: Diagnosis not present

## 2022-01-31 DIAGNOSIS — Z6841 Body Mass Index (BMI) 40.0 and over, adult: Secondary | ICD-10-CM | POA: Diagnosis not present

## 2022-01-31 DIAGNOSIS — R509 Fever, unspecified: Secondary | ICD-10-CM | POA: Diagnosis not present

## 2022-02-01 ENCOUNTER — Other Ambulatory Visit: Payer: Self-pay | Admitting: Family Medicine

## 2022-02-01 DIAGNOSIS — R0602 Shortness of breath: Secondary | ICD-10-CM

## 2022-02-06 DIAGNOSIS — R0602 Shortness of breath: Secondary | ICD-10-CM | POA: Diagnosis not present

## 2022-02-06 DIAGNOSIS — Z170 Estrogen receptor positive status [ER+]: Secondary | ICD-10-CM | POA: Diagnosis not present

## 2022-02-06 DIAGNOSIS — R051 Acute cough: Secondary | ICD-10-CM | POA: Diagnosis not present

## 2022-02-06 DIAGNOSIS — M8589 Other specified disorders of bone density and structure, multiple sites: Secondary | ICD-10-CM | POA: Diagnosis not present

## 2022-02-06 DIAGNOSIS — R509 Fever, unspecified: Secondary | ICD-10-CM | POA: Diagnosis not present

## 2022-02-06 DIAGNOSIS — C50511 Malignant neoplasm of lower-outer quadrant of right female breast: Secondary | ICD-10-CM | POA: Diagnosis not present

## 2022-02-13 DIAGNOSIS — G35 Multiple sclerosis: Secondary | ICD-10-CM | POA: Diagnosis not present

## 2022-02-13 DIAGNOSIS — R0600 Dyspnea, unspecified: Secondary | ICD-10-CM | POA: Diagnosis not present

## 2022-02-13 DIAGNOSIS — M797 Fibromyalgia: Secondary | ICD-10-CM | POA: Diagnosis not present

## 2022-02-13 DIAGNOSIS — F332 Major depressive disorder, recurrent severe without psychotic features: Secondary | ICD-10-CM | POA: Diagnosis not present

## 2022-02-13 DIAGNOSIS — I10 Essential (primary) hypertension: Secondary | ICD-10-CM | POA: Diagnosis not present

## 2022-02-14 DIAGNOSIS — C50511 Malignant neoplasm of lower-outer quadrant of right female breast: Secondary | ICD-10-CM | POA: Diagnosis not present

## 2022-02-14 DIAGNOSIS — Z170 Estrogen receptor positive status [ER+]: Secondary | ICD-10-CM | POA: Diagnosis not present

## 2022-02-14 DIAGNOSIS — K760 Fatty (change of) liver, not elsewhere classified: Secondary | ICD-10-CM | POA: Diagnosis not present

## 2022-02-14 DIAGNOSIS — R0602 Shortness of breath: Secondary | ICD-10-CM | POA: Diagnosis not present

## 2022-02-14 DIAGNOSIS — Z9049 Acquired absence of other specified parts of digestive tract: Secondary | ICD-10-CM | POA: Diagnosis not present

## 2022-02-14 DIAGNOSIS — R161 Splenomegaly, not elsewhere classified: Secondary | ICD-10-CM | POA: Diagnosis not present

## 2022-02-14 DIAGNOSIS — R051 Acute cough: Secondary | ICD-10-CM | POA: Diagnosis not present

## 2022-02-14 DIAGNOSIS — R509 Fever, unspecified: Secondary | ICD-10-CM | POA: Diagnosis not present

## 2022-03-06 DIAGNOSIS — G4700 Insomnia, unspecified: Secondary | ICD-10-CM | POA: Diagnosis not present

## 2022-03-06 DIAGNOSIS — F3341 Major depressive disorder, recurrent, in partial remission: Secondary | ICD-10-CM | POA: Diagnosis not present

## 2022-03-06 DIAGNOSIS — I10 Essential (primary) hypertension: Secondary | ICD-10-CM | POA: Diagnosis not present

## 2022-03-06 DIAGNOSIS — F332 Major depressive disorder, recurrent severe without psychotic features: Secondary | ICD-10-CM | POA: Diagnosis not present

## 2022-08-07 DIAGNOSIS — F332 Major depressive disorder, recurrent severe without psychotic features: Secondary | ICD-10-CM | POA: Diagnosis not present

## 2022-08-07 DIAGNOSIS — Z79899 Other long term (current) drug therapy: Secondary | ICD-10-CM | POA: Diagnosis not present

## 2022-08-07 DIAGNOSIS — Z5181 Encounter for therapeutic drug level monitoring: Secondary | ICD-10-CM | POA: Diagnosis not present

## 2022-08-07 DIAGNOSIS — F418 Other specified anxiety disorders: Secondary | ICD-10-CM | POA: Diagnosis not present

## 2022-08-07 DIAGNOSIS — Z6841 Body Mass Index (BMI) 40.0 and over, adult: Secondary | ICD-10-CM | POA: Diagnosis not present

## 2022-08-07 DIAGNOSIS — Z853 Personal history of malignant neoplasm of breast: Secondary | ICD-10-CM | POA: Diagnosis not present

## 2022-08-07 DIAGNOSIS — N6489 Other specified disorders of breast: Secondary | ICD-10-CM | POA: Diagnosis not present

## 2022-08-07 DIAGNOSIS — Z7983 Long term (current) use of bisphosphonates: Secondary | ICD-10-CM | POA: Diagnosis not present

## 2022-08-07 DIAGNOSIS — M8589 Other specified disorders of bone density and structure, multiple sites: Secondary | ICD-10-CM | POA: Diagnosis not present

## 2022-08-07 DIAGNOSIS — E669 Obesity, unspecified: Secondary | ICD-10-CM | POA: Diagnosis not present

## 2022-08-07 DIAGNOSIS — K7469 Other cirrhosis of liver: Secondary | ICD-10-CM | POA: Diagnosis not present

## 2022-08-07 DIAGNOSIS — G35 Multiple sclerosis: Secondary | ICD-10-CM | POA: Diagnosis not present

## 2022-08-07 DIAGNOSIS — E876 Hypokalemia: Secondary | ICD-10-CM | POA: Diagnosis not present

## 2022-08-07 DIAGNOSIS — Z08 Encounter for follow-up examination after completed treatment for malignant neoplasm: Secondary | ICD-10-CM | POA: Diagnosis not present

## 2022-08-07 DIAGNOSIS — C50511 Malignant neoplasm of lower-outer quadrant of right female breast: Secondary | ICD-10-CM | POA: Diagnosis not present

## 2022-08-07 DIAGNOSIS — Z780 Asymptomatic menopausal state: Secondary | ICD-10-CM | POA: Diagnosis not present

## 2022-08-13 DIAGNOSIS — K7460 Unspecified cirrhosis of liver: Secondary | ICD-10-CM | POA: Diagnosis not present

## 2022-08-13 DIAGNOSIS — R112 Nausea with vomiting, unspecified: Secondary | ICD-10-CM | POA: Diagnosis not present

## 2022-08-13 DIAGNOSIS — E669 Obesity, unspecified: Secondary | ICD-10-CM | POA: Diagnosis not present

## 2022-08-13 DIAGNOSIS — R1319 Other dysphagia: Secondary | ICD-10-CM | POA: Diagnosis not present

## 2022-08-13 DIAGNOSIS — Z1211 Encounter for screening for malignant neoplasm of colon: Secondary | ICD-10-CM | POA: Diagnosis not present

## 2022-08-13 DIAGNOSIS — I10 Essential (primary) hypertension: Secondary | ICD-10-CM | POA: Diagnosis not present

## 2022-08-15 DIAGNOSIS — R92323 Mammographic fibroglandular density, bilateral breasts: Secondary | ICD-10-CM | POA: Diagnosis not present

## 2022-08-15 DIAGNOSIS — Z1231 Encounter for screening mammogram for malignant neoplasm of breast: Secondary | ICD-10-CM | POA: Diagnosis not present

## 2022-08-16 DIAGNOSIS — Z1211 Encounter for screening for malignant neoplasm of colon: Secondary | ICD-10-CM | POA: Diagnosis not present

## 2022-08-16 DIAGNOSIS — K621 Rectal polyp: Secondary | ICD-10-CM | POA: Diagnosis not present

## 2022-08-16 DIAGNOSIS — K7460 Unspecified cirrhosis of liver: Secondary | ICD-10-CM | POA: Diagnosis not present

## 2022-08-16 DIAGNOSIS — D123 Benign neoplasm of transverse colon: Secondary | ICD-10-CM | POA: Diagnosis not present

## 2022-08-16 DIAGNOSIS — K635 Polyp of colon: Secondary | ICD-10-CM | POA: Diagnosis not present

## 2022-08-20 DIAGNOSIS — Z131 Encounter for screening for diabetes mellitus: Secondary | ICD-10-CM | POA: Diagnosis not present

## 2022-08-20 DIAGNOSIS — E876 Hypokalemia: Secondary | ICD-10-CM | POA: Diagnosis not present

## 2022-08-20 DIAGNOSIS — Z6841 Body Mass Index (BMI) 40.0 and over, adult: Secondary | ICD-10-CM | POA: Diagnosis not present

## 2022-08-20 DIAGNOSIS — F324 Major depressive disorder, single episode, in partial remission: Secondary | ICD-10-CM | POA: Diagnosis not present

## 2022-08-20 DIAGNOSIS — I10 Essential (primary) hypertension: Secondary | ICD-10-CM | POA: Diagnosis not present

## 2022-08-20 DIAGNOSIS — Z1330 Encounter for screening examination for mental health and behavioral disorders, unspecified: Secondary | ICD-10-CM | POA: Diagnosis not present

## 2022-08-30 DIAGNOSIS — K7689 Other specified diseases of liver: Secondary | ICD-10-CM | POA: Diagnosis not present

## 2022-08-30 DIAGNOSIS — K760 Fatty (change of) liver, not elsewhere classified: Secondary | ICD-10-CM | POA: Diagnosis not present

## 2022-08-30 DIAGNOSIS — N2889 Other specified disorders of kidney and ureter: Secondary | ICD-10-CM | POA: Diagnosis not present

## 2022-08-30 DIAGNOSIS — K7460 Unspecified cirrhosis of liver: Secondary | ICD-10-CM | POA: Diagnosis not present

## 2022-12-11 DIAGNOSIS — E6609 Other obesity due to excess calories: Secondary | ICD-10-CM | POA: Diagnosis not present

## 2022-12-11 DIAGNOSIS — Z1331 Encounter for screening for depression: Secondary | ICD-10-CM | POA: Diagnosis not present

## 2022-12-11 DIAGNOSIS — F32A Depression, unspecified: Secondary | ICD-10-CM | POA: Diagnosis not present

## 2022-12-11 DIAGNOSIS — Z6841 Body Mass Index (BMI) 40.0 and over, adult: Secondary | ICD-10-CM | POA: Diagnosis not present

## 2022-12-11 DIAGNOSIS — Z7182 Exercise counseling: Secondary | ICD-10-CM | POA: Diagnosis not present

## 2022-12-11 DIAGNOSIS — Z713 Dietary counseling and surveillance: Secondary | ICD-10-CM | POA: Diagnosis not present

## 2022-12-26 DIAGNOSIS — Z6841 Body Mass Index (BMI) 40.0 and over, adult: Secondary | ICD-10-CM | POA: Diagnosis not present

## 2023-01-07 DIAGNOSIS — Z6841 Body Mass Index (BMI) 40.0 and over, adult: Secondary | ICD-10-CM | POA: Diagnosis not present

## 2023-01-08 DIAGNOSIS — Z6841 Body Mass Index (BMI) 40.0 and over, adult: Secondary | ICD-10-CM | POA: Diagnosis not present

## 2023-01-08 DIAGNOSIS — E6609 Other obesity due to excess calories: Secondary | ICD-10-CM | POA: Diagnosis not present

## 2023-01-08 DIAGNOSIS — I10 Essential (primary) hypertension: Secondary | ICD-10-CM | POA: Diagnosis not present

## 2023-01-14 DIAGNOSIS — M797 Fibromyalgia: Secondary | ICD-10-CM | POA: Diagnosis not present

## 2023-01-14 DIAGNOSIS — R519 Headache, unspecified: Secondary | ICD-10-CM | POA: Diagnosis not present

## 2023-01-21 DIAGNOSIS — Z7983 Long term (current) use of bisphosphonates: Secondary | ICD-10-CM | POA: Diagnosis not present

## 2023-01-21 DIAGNOSIS — M8589 Other specified disorders of bone density and structure, multiple sites: Secondary | ICD-10-CM | POA: Diagnosis not present

## 2023-01-21 DIAGNOSIS — Z780 Asymptomatic menopausal state: Secondary | ICD-10-CM | POA: Diagnosis not present

## 2023-01-21 DIAGNOSIS — Z6841 Body Mass Index (BMI) 40.0 and over, adult: Secondary | ICD-10-CM | POA: Diagnosis not present

## 2023-03-08 DIAGNOSIS — Z23 Encounter for immunization: Secondary | ICD-10-CM | POA: Diagnosis not present

## 2023-03-08 DIAGNOSIS — E559 Vitamin D deficiency, unspecified: Secondary | ICD-10-CM | POA: Diagnosis not present

## 2023-03-08 DIAGNOSIS — I10 Essential (primary) hypertension: Secondary | ICD-10-CM | POA: Diagnosis not present

## 2023-03-08 DIAGNOSIS — B372 Candidiasis of skin and nail: Secondary | ICD-10-CM | POA: Diagnosis not present

## 2023-03-08 DIAGNOSIS — L299 Pruritus, unspecified: Secondary | ICD-10-CM | POA: Diagnosis not present

## 2023-03-31 DIAGNOSIS — L03032 Cellulitis of left toe: Secondary | ICD-10-CM | POA: Diagnosis not present

## 2023-03-31 DIAGNOSIS — H66001 Acute suppurative otitis media without spontaneous rupture of ear drum, right ear: Secondary | ICD-10-CM | POA: Diagnosis not present

## 2023-04-11 DIAGNOSIS — L732 Hidradenitis suppurativa: Secondary | ICD-10-CM | POA: Diagnosis not present

## 2023-04-11 DIAGNOSIS — L918 Other hypertrophic disorders of the skin: Secondary | ICD-10-CM | POA: Diagnosis not present

## 2023-05-03 DIAGNOSIS — I10 Essential (primary) hypertension: Secondary | ICD-10-CM | POA: Diagnosis not present

## 2023-05-03 DIAGNOSIS — M25511 Pain in right shoulder: Secondary | ICD-10-CM | POA: Diagnosis not present

## 2023-05-03 DIAGNOSIS — G8929 Other chronic pain: Secondary | ICD-10-CM | POA: Diagnosis not present
